# Patient Record
Sex: Female | Born: 1935 | Race: White | Hispanic: No | Smoking: Never smoker
Health system: Southern US, Community
[De-identification: ages and names within clinical notes are randomized; demographics above are authoritative.]

## PROBLEM LIST (undated history)

## (undated) DIAGNOSIS — Z9889 Other specified postprocedural states: Secondary | ICD-10-CM

## (undated) DIAGNOSIS — I10 Essential (primary) hypertension: Secondary | ICD-10-CM

## (undated) DIAGNOSIS — E559 Vitamin D deficiency, unspecified: Secondary | ICD-10-CM

## (undated) DIAGNOSIS — M1990 Unspecified osteoarthritis, unspecified site: Secondary | ICD-10-CM

---

## 2000-10-06 ENCOUNTER — Encounter: Admission: RE | Admit: 2000-10-06 | Discharge: 2000-10-06 | Payer: Self-pay

## 2019-02-07 ENCOUNTER — Other Ambulatory Visit: Payer: Self-pay

## 2019-02-07 NOTE — Patient Outreach (Signed)
[facility]) Care Management  02/07/2019  [name] 06-Feb-1935 [id number]  Medication Adherence call to Mrs. Amber Mckay HIPPA Compliant Voice message left with a call back number. [name] is showing past due under [place] [employer].   [name] CPhT Pharmacy Technician [facility] Care Management Direct Dial [phone]  Fax [phone] [name]

## 2020-08-12 ENCOUNTER — Other Ambulatory Visit: Payer: Self-pay | Admitting: Family Medicine

## 2020-08-12 ENCOUNTER — Other Ambulatory Visit (HOSPITAL_COMMUNITY): Payer: Self-pay | Admitting: Family Medicine

## 2020-08-12 DIAGNOSIS — R601 Generalized edema: Secondary | ICD-10-CM

## 2020-08-13 ENCOUNTER — Ambulatory Visit (HOSPITAL_COMMUNITY)
Admission: RE | Admit: 2020-08-13 | Discharge: 2020-08-13 | Disposition: A | Discharge status: Home / Self Care | Payer: Medicare PPO | Source: Ambulatory Visit | Attending: Family Medicine | Admitting: Family Medicine

## 2020-08-13 ENCOUNTER — Other Ambulatory Visit: Payer: Self-pay

## 2020-08-13 DIAGNOSIS — R601 Generalized edema: Secondary | ICD-10-CM | POA: Insufficient documentation

## 2020-09-30 DIAGNOSIS — I10 Essential (primary) hypertension: Secondary | ICD-10-CM | POA: Diagnosis not present

## 2020-09-30 DIAGNOSIS — D649 Anemia, unspecified: Secondary | ICD-10-CM | POA: Diagnosis not present

## 2020-09-30 DIAGNOSIS — D638 Anemia in other chronic diseases classified elsewhere: Secondary | ICD-10-CM | POA: Diagnosis not present

## 2020-09-30 DIAGNOSIS — Z79899 Other long term (current) drug therapy: Secondary | ICD-10-CM | POA: Diagnosis not present

## 2020-09-30 DIAGNOSIS — D509 Iron deficiency anemia, unspecified: Secondary | ICD-10-CM | POA: Diagnosis not present

## 2020-10-05 DIAGNOSIS — D509 Iron deficiency anemia, unspecified: Secondary | ICD-10-CM | POA: Diagnosis not present

## 2020-10-05 DIAGNOSIS — Z6823 Body mass index (BMI) 23.0-23.9, adult: Secondary | ICD-10-CM | POA: Diagnosis not present

## 2020-11-05 DIAGNOSIS — Z6823 Body mass index (BMI) 23.0-23.9, adult: Secondary | ICD-10-CM | POA: Diagnosis not present

## 2020-11-05 DIAGNOSIS — M810 Age-related osteoporosis without current pathological fracture: Secondary | ICD-10-CM | POA: Diagnosis not present

## 2020-11-05 DIAGNOSIS — M25562 Pain in left knee: Secondary | ICD-10-CM | POA: Diagnosis not present

## 2020-11-05 DIAGNOSIS — M150 Primary generalized (osteo)arthritis: Secondary | ICD-10-CM | POA: Diagnosis not present

## 2020-11-05 DIAGNOSIS — M7581 Other shoulder lesions, right shoulder: Secondary | ICD-10-CM | POA: Diagnosis not present

## 2020-11-05 DIAGNOSIS — M0609 Rheumatoid arthritis without rheumatoid factor, multiple sites: Secondary | ICD-10-CM | POA: Diagnosis not present

## 2020-11-05 DIAGNOSIS — E559 Vitamin D deficiency, unspecified: Secondary | ICD-10-CM | POA: Diagnosis not present

## 2020-12-23 DIAGNOSIS — I10 Essential (primary) hypertension: Secondary | ICD-10-CM | POA: Diagnosis not present

## 2020-12-23 DIAGNOSIS — Z13220 Encounter for screening for lipoid disorders: Secondary | ICD-10-CM | POA: Diagnosis not present

## 2020-12-23 DIAGNOSIS — Z79899 Other long term (current) drug therapy: Secondary | ICD-10-CM | POA: Diagnosis not present

## 2020-12-28 DIAGNOSIS — Z1389 Encounter for screening for other disorder: Secondary | ICD-10-CM | POA: Diagnosis not present

## 2020-12-28 DIAGNOSIS — Z79899 Other long term (current) drug therapy: Secondary | ICD-10-CM | POA: Diagnosis not present

## 2020-12-28 DIAGNOSIS — Z1331 Encounter for screening for depression: Secondary | ICD-10-CM | POA: Diagnosis not present

## 2020-12-28 DIAGNOSIS — R197 Diarrhea, unspecified: Secondary | ICD-10-CM | POA: Diagnosis not present

## 2020-12-28 DIAGNOSIS — J4540 Moderate persistent asthma, uncomplicated: Secondary | ICD-10-CM | POA: Diagnosis not present

## 2020-12-28 DIAGNOSIS — I10 Essential (primary) hypertension: Secondary | ICD-10-CM | POA: Diagnosis not present

## 2020-12-28 DIAGNOSIS — M0609 Rheumatoid arthritis without rheumatoid factor, multiple sites: Secondary | ICD-10-CM | POA: Diagnosis not present

## 2020-12-28 DIAGNOSIS — R42 Dizziness and giddiness: Secondary | ICD-10-CM | POA: Diagnosis not present

## 2021-01-29 DIAGNOSIS — M25511 Pain in right shoulder: Secondary | ICD-10-CM | POA: Diagnosis not present

## 2021-02-04 DIAGNOSIS — M0609 Rheumatoid arthritis without rheumatoid factor, multiple sites: Secondary | ICD-10-CM | POA: Diagnosis not present

## 2021-02-04 DIAGNOSIS — M25562 Pain in left knee: Secondary | ICD-10-CM | POA: Diagnosis not present

## 2021-02-04 DIAGNOSIS — M810 Age-related osteoporosis without current pathological fracture: Secondary | ICD-10-CM | POA: Diagnosis not present

## 2021-02-04 DIAGNOSIS — M150 Primary generalized (osteo)arthritis: Secondary | ICD-10-CM | POA: Diagnosis not present

## 2021-02-04 DIAGNOSIS — Z6824 Body mass index (BMI) 24.0-24.9, adult: Secondary | ICD-10-CM | POA: Diagnosis not present

## 2021-02-04 DIAGNOSIS — E559 Vitamin D deficiency, unspecified: Secondary | ICD-10-CM | POA: Diagnosis not present

## 2021-02-04 DIAGNOSIS — M7581 Other shoulder lesions, right shoulder: Secondary | ICD-10-CM | POA: Diagnosis not present

## 2021-03-08 DIAGNOSIS — Z23 Encounter for immunization: Secondary | ICD-10-CM | POA: Diagnosis not present

## 2021-03-25 DIAGNOSIS — I10 Essential (primary) hypertension: Secondary | ICD-10-CM | POA: Diagnosis not present

## 2021-03-25 DIAGNOSIS — M0609 Rheumatoid arthritis without rheumatoid factor, multiple sites: Secondary | ICD-10-CM | POA: Diagnosis not present

## 2021-03-27 DIAGNOSIS — R300 Dysuria: Secondary | ICD-10-CM | POA: Diagnosis not present

## 2021-03-27 DIAGNOSIS — Z6823 Body mass index (BMI) 23.0-23.9, adult: Secondary | ICD-10-CM | POA: Diagnosis not present

## 2021-06-18 DIAGNOSIS — Z79899 Other long term (current) drug therapy: Secondary | ICD-10-CM | POA: Diagnosis not present

## 2021-06-18 DIAGNOSIS — Z13220 Encounter for screening for lipoid disorders: Secondary | ICD-10-CM | POA: Diagnosis not present

## 2021-06-24 DIAGNOSIS — Z79899 Other long term (current) drug therapy: Secondary | ICD-10-CM | POA: Diagnosis not present

## 2021-06-24 DIAGNOSIS — M0609 Rheumatoid arthritis without rheumatoid factor, multiple sites: Secondary | ICD-10-CM | POA: Diagnosis not present

## 2021-06-24 DIAGNOSIS — Z6823 Body mass index (BMI) 23.0-23.9, adult: Secondary | ICD-10-CM | POA: Diagnosis not present

## 2021-06-24 DIAGNOSIS — I10 Essential (primary) hypertension: Secondary | ICD-10-CM | POA: Diagnosis not present

## 2021-06-24 DIAGNOSIS — R634 Abnormal weight loss: Secondary | ICD-10-CM | POA: Diagnosis not present

## 2021-06-24 DIAGNOSIS — J4540 Moderate persistent asthma, uncomplicated: Secondary | ICD-10-CM | POA: Diagnosis not present

## 2021-06-24 DIAGNOSIS — R42 Dizziness and giddiness: Secondary | ICD-10-CM | POA: Diagnosis not present

## 2021-06-24 DIAGNOSIS — R197 Diarrhea, unspecified: Secondary | ICD-10-CM | POA: Diagnosis not present

## 2021-07-16 IMAGING — US US EXTREM LOW VENOUS*R*
1 series · 13 of 24 positions shown · non-contrast
Comparison: None.

CLINICAL DATA: Right lower extremity edema for the past 2 weeks.
Evaluate for DVT.



[Series 1: us venous img lower uni right (dvt) · portal-venous · 13 of 36 slices shown]
[im 1/36]
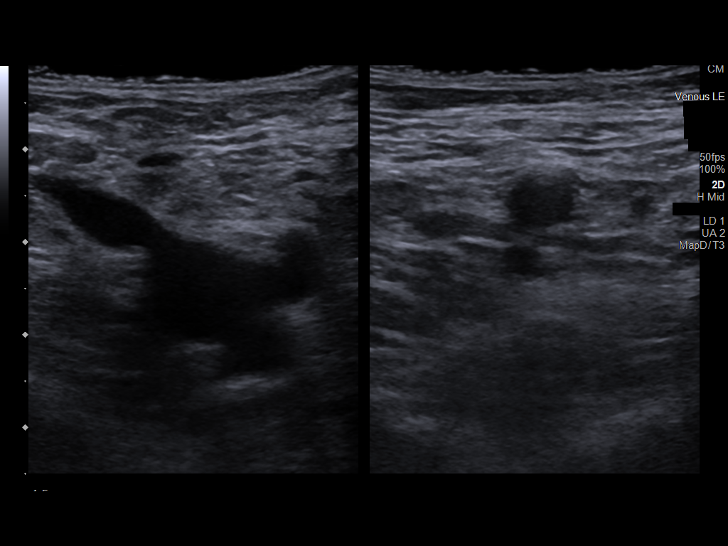
[im 4/36]
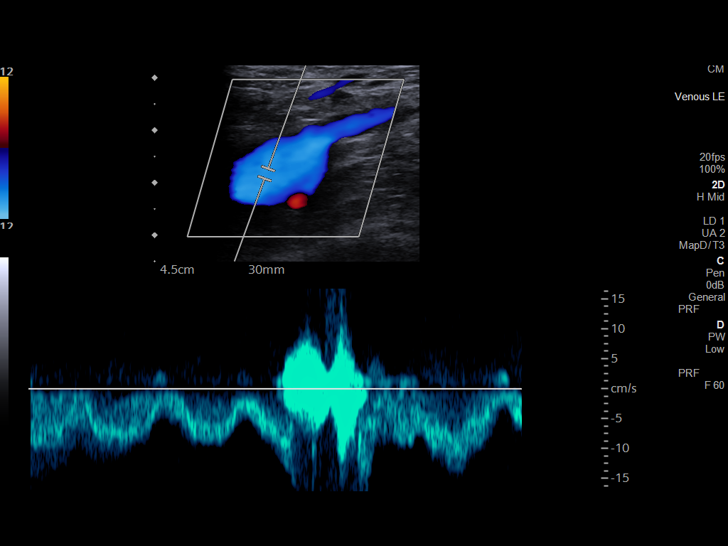
[im 7/36]
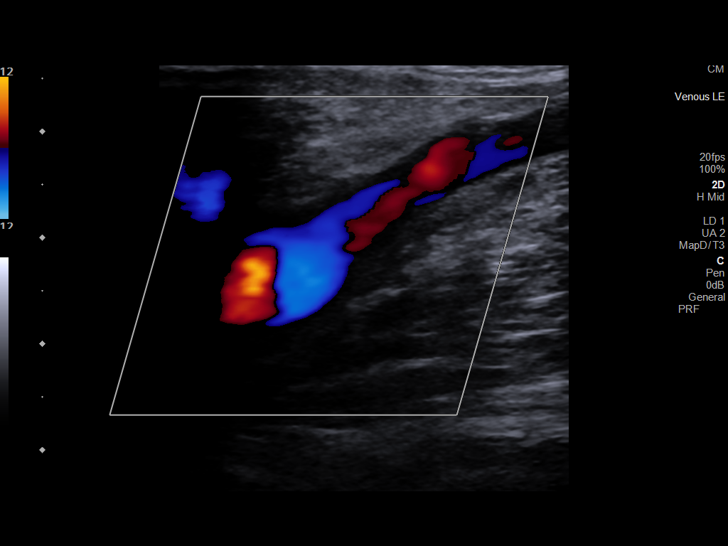
[im 10/36]
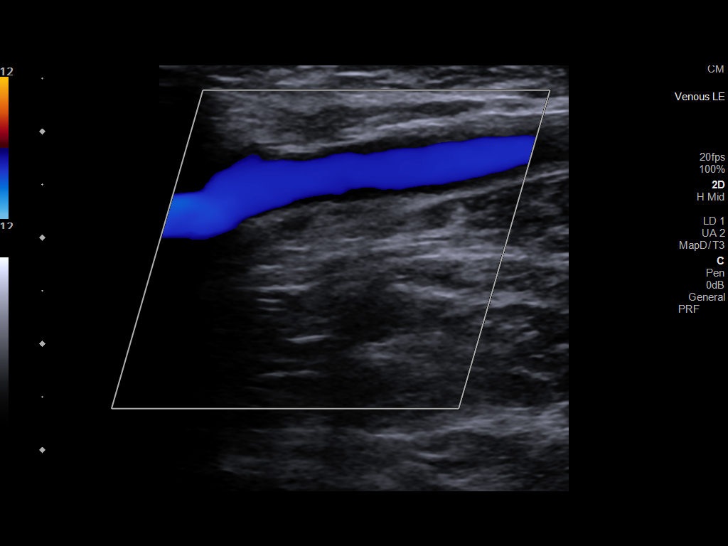
[im 13/36]
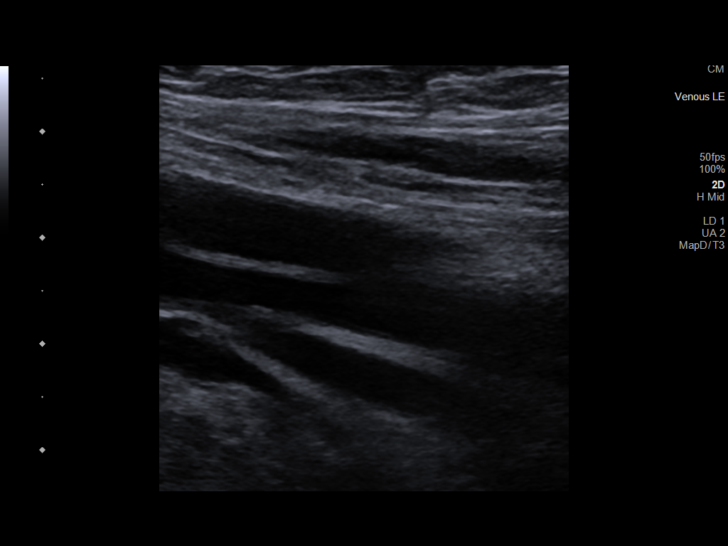
[im 16/36]
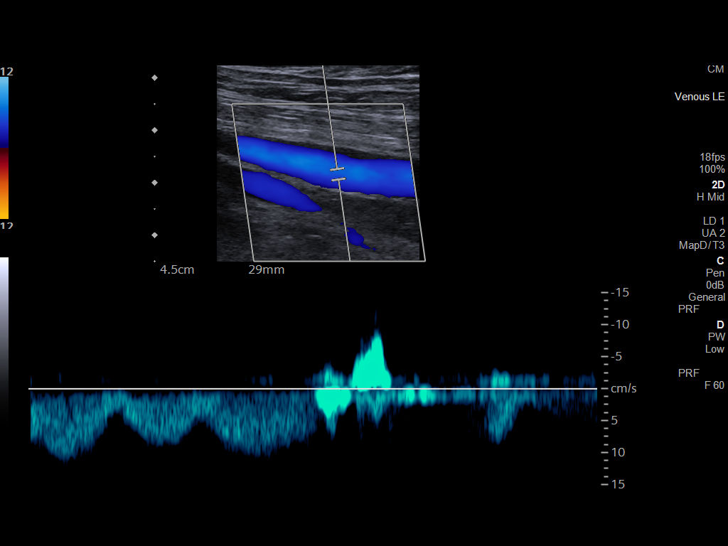
[im 19/36]
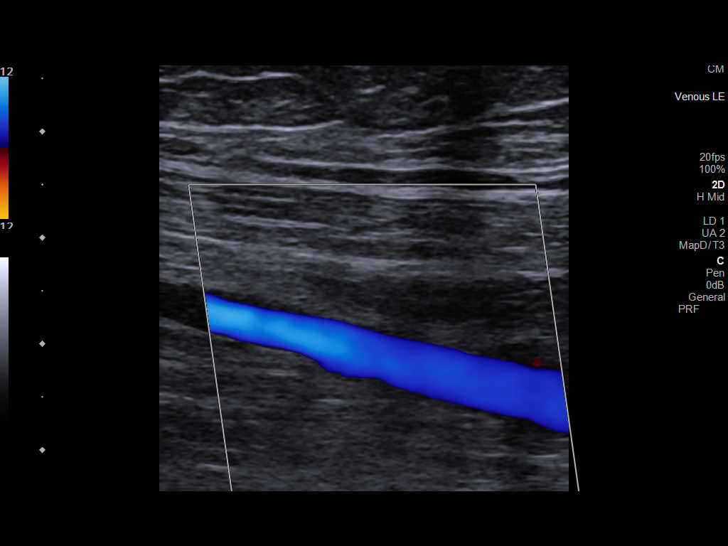
[im 20/36]
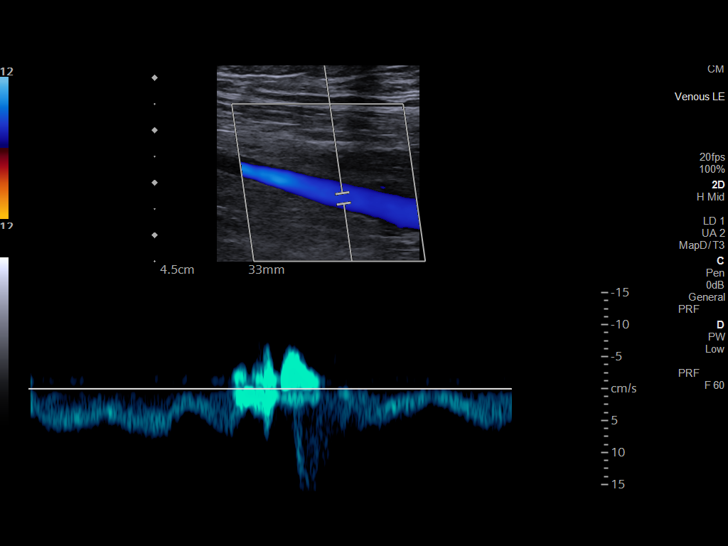
[im 23/36]
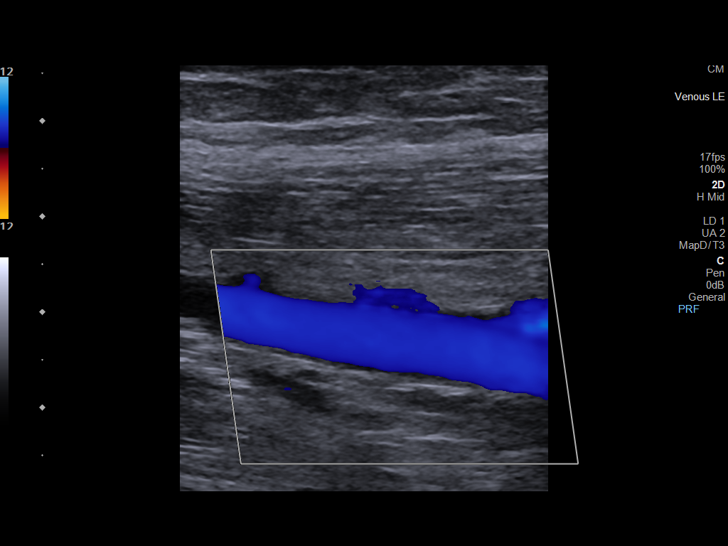
[im 26/36]
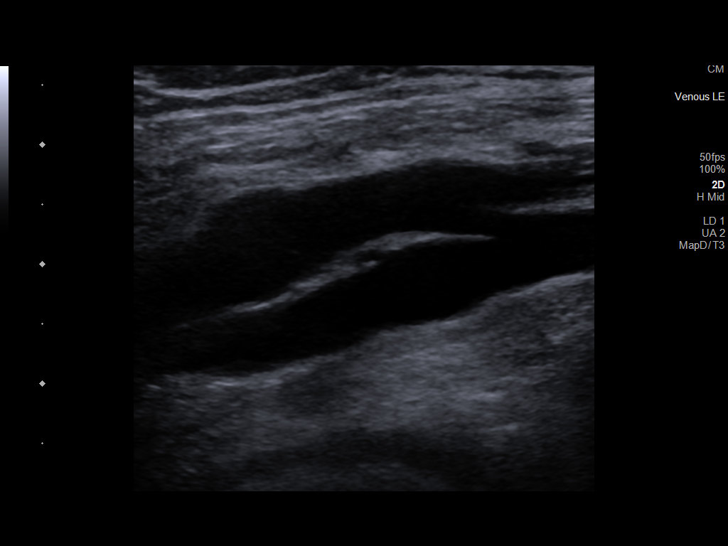
[im 29/36]
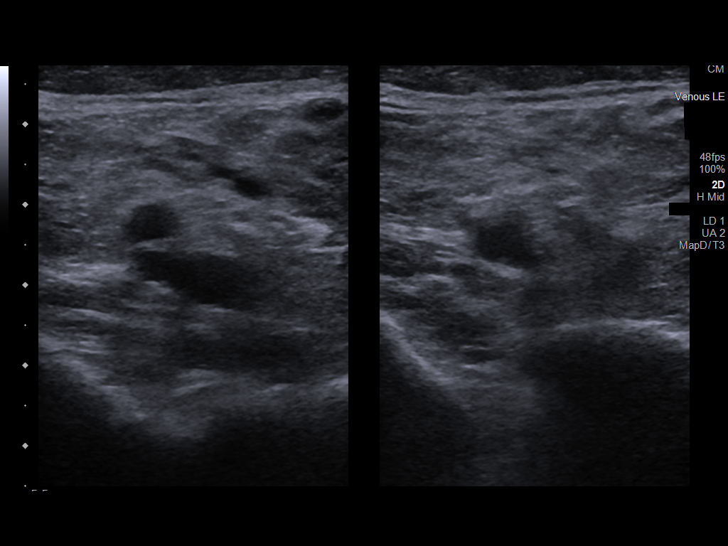
[im 32/36]
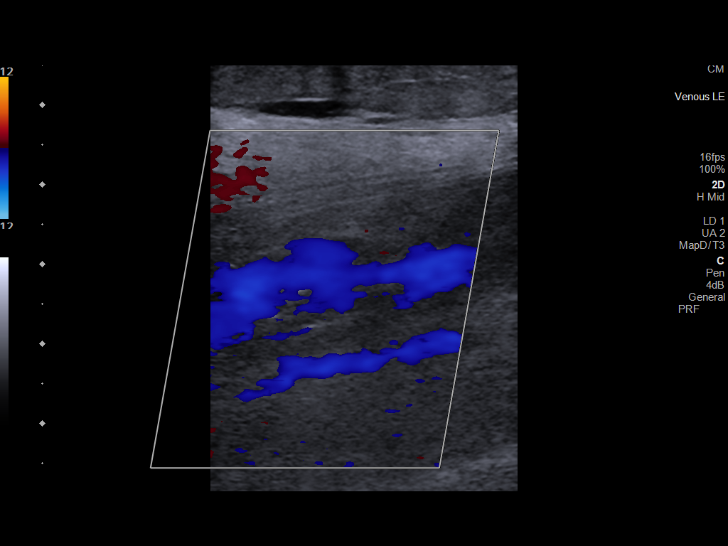
[im 36/36]
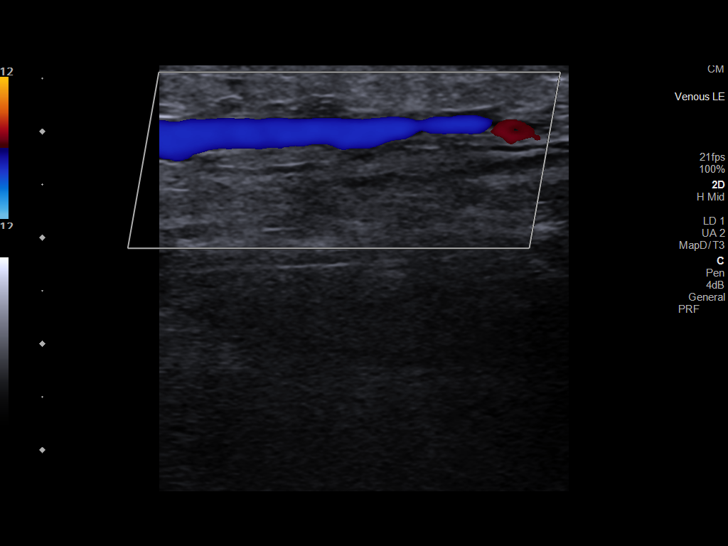

[13 of 24 positions shown; findings below may reference images not displayed]

FINDINGS: Contralateral Common Femoral Vein: Respiratory phasicity is normal
and symmetric with the symptomatic side. No evidence of thrombus.
Normal compressibility.

Common Femoral Vein: No evidence of thrombus. Normal
compressibility, respiratory phasicity and response to augmentation.

Saphenofemoral Junction: No evidence of thrombus. Normal
compressibility and flow on color Doppler imaging.

Profunda Femoral Vein: No evidence of thrombus. Normal
compressibility and flow on color Doppler imaging.

Femoral Vein: No evidence of thrombus. Normal compressibility,
respiratory phasicity and response to augmentation.

Popliteal Vein: No evidence of thrombus. Normal compressibility,
respiratory phasicity and response to augmentation.

Calf Veins: No evidence of thrombus. Normal compressibility and flow
on color Doppler imaging.

Superficial Great Saphenous Vein: No evidence of thrombus. Normal
compressibility.

Venous Reflux:  None.

Other Findings: There is a minimal amount of subcutaneous edema
involving the anterior aspect of the shin (image 36), without
definable/drainable fluid collection. Otherwise, there is no
sonographic correlate for patient's palpable area of concern.
Specifically, no regional superficial thrombophlebitis.
IMPRESSION: 1. No evidence of DVT within the right lower extremity.
2. Minimal amount of subcutaneous edema involving the anterior
aspect of the shin without definable/drainable fluid collection.

## 2021-08-09 DIAGNOSIS — M25562 Pain in left knee: Secondary | ICD-10-CM | POA: Diagnosis not present

## 2021-08-09 DIAGNOSIS — E559 Vitamin D deficiency, unspecified: Secondary | ICD-10-CM | POA: Diagnosis not present

## 2021-08-09 DIAGNOSIS — M150 Primary generalized (osteo)arthritis: Secondary | ICD-10-CM | POA: Diagnosis not present

## 2021-08-09 DIAGNOSIS — M0609 Rheumatoid arthritis without rheumatoid factor, multiple sites: Secondary | ICD-10-CM | POA: Diagnosis not present

## 2021-08-09 DIAGNOSIS — M810 Age-related osteoporosis without current pathological fracture: Secondary | ICD-10-CM | POA: Diagnosis not present

## 2021-08-09 DIAGNOSIS — M7581 Other shoulder lesions, right shoulder: Secondary | ICD-10-CM | POA: Diagnosis not present

## 2021-08-09 DIAGNOSIS — Z6823 Body mass index (BMI) 23.0-23.9, adult: Secondary | ICD-10-CM | POA: Diagnosis not present

## 2021-08-18 DIAGNOSIS — Z23 Encounter for immunization: Secondary | ICD-10-CM | POA: Diagnosis not present

## 2021-09-21 DIAGNOSIS — I10 Essential (primary) hypertension: Secondary | ICD-10-CM | POA: Diagnosis not present

## 2021-09-21 DIAGNOSIS — D509 Iron deficiency anemia, unspecified: Secondary | ICD-10-CM | POA: Diagnosis not present

## 2021-09-21 DIAGNOSIS — Z79899 Other long term (current) drug therapy: Secondary | ICD-10-CM | POA: Diagnosis not present

## 2021-10-12 DIAGNOSIS — Z8679 Personal history of other diseases of the circulatory system: Secondary | ICD-10-CM | POA: Diagnosis not present

## 2021-10-12 DIAGNOSIS — I517 Cardiomegaly: Secondary | ICD-10-CM | POA: Diagnosis not present

## 2021-10-12 DIAGNOSIS — I10 Essential (primary) hypertension: Secondary | ICD-10-CM | POA: Diagnosis not present

## 2021-10-12 DIAGNOSIS — R202 Paresthesia of skin: Secondary | ICD-10-CM | POA: Diagnosis not present

## 2021-10-12 DIAGNOSIS — R200 Anesthesia of skin: Secondary | ICD-10-CM | POA: Diagnosis not present

## 2021-10-12 DIAGNOSIS — I493 Ventricular premature depolarization: Secondary | ICD-10-CM | POA: Diagnosis not present

## 2021-10-12 DIAGNOSIS — R4182 Altered mental status, unspecified: Secondary | ICD-10-CM | POA: Diagnosis not present

## 2021-10-12 DIAGNOSIS — I4891 Unspecified atrial fibrillation: Secondary | ICD-10-CM | POA: Diagnosis not present

## 2021-10-12 DIAGNOSIS — Z20822 Contact with and (suspected) exposure to covid-19: Secondary | ICD-10-CM | POA: Diagnosis not present

## 2021-10-12 DIAGNOSIS — I672 Cerebral atherosclerosis: Secondary | ICD-10-CM | POA: Diagnosis not present

## 2021-10-12 DIAGNOSIS — E876 Hypokalemia: Secondary | ICD-10-CM | POA: Diagnosis not present

## 2021-10-12 DIAGNOSIS — M069 Rheumatoid arthritis, unspecified: Secondary | ICD-10-CM | POA: Diagnosis not present

## 2021-10-12 DIAGNOSIS — R29700 NIHSS score 0: Secondary | ICD-10-CM | POA: Diagnosis not present

## 2021-10-12 DIAGNOSIS — K449 Diaphragmatic hernia without obstruction or gangrene: Secondary | ICD-10-CM | POA: Diagnosis not present

## 2021-10-12 DIAGNOSIS — I639 Cerebral infarction, unspecified: Secondary | ICD-10-CM | POA: Diagnosis not present

## 2021-10-12 DIAGNOSIS — R4189 Other symptoms and signs involving cognitive functions and awareness: Secondary | ICD-10-CM | POA: Diagnosis not present

## 2021-10-12 DIAGNOSIS — I6523 Occlusion and stenosis of bilateral carotid arteries: Secondary | ICD-10-CM | POA: Diagnosis not present

## 2021-10-12 DIAGNOSIS — R29818 Other symptoms and signs involving the nervous system: Secondary | ICD-10-CM | POA: Diagnosis not present

## 2021-10-13 DIAGNOSIS — I639 Cerebral infarction, unspecified: Secondary | ICD-10-CM | POA: Diagnosis not present

## 2021-10-13 DIAGNOSIS — I340 Nonrheumatic mitral (valve) insufficiency: Secondary | ICD-10-CM | POA: Diagnosis not present

## 2021-10-13 DIAGNOSIS — I5189 Other ill-defined heart diseases: Secondary | ICD-10-CM | POA: Diagnosis not present

## 2021-10-13 DIAGNOSIS — I517 Cardiomegaly: Secondary | ICD-10-CM | POA: Diagnosis not present

## 2021-10-13 DIAGNOSIS — I4891 Unspecified atrial fibrillation: Secondary | ICD-10-CM | POA: Diagnosis not present

## 2021-10-28 DIAGNOSIS — J4541 Moderate persistent asthma with (acute) exacerbation: Secondary | ICD-10-CM | POA: Diagnosis not present

## 2021-10-28 DIAGNOSIS — J45901 Unspecified asthma with (acute) exacerbation: Secondary | ICD-10-CM | POA: Diagnosis not present

## 2021-10-28 DIAGNOSIS — I6523 Occlusion and stenosis of bilateral carotid arteries: Secondary | ICD-10-CM | POA: Diagnosis not present

## 2021-10-28 DIAGNOSIS — I679 Cerebrovascular disease, unspecified: Secondary | ICD-10-CM | POA: Diagnosis not present

## 2021-10-28 DIAGNOSIS — M0609 Rheumatoid arthritis without rheumatoid factor, multiple sites: Secondary | ICD-10-CM | POA: Diagnosis not present

## 2021-11-12 DIAGNOSIS — I679 Cerebrovascular disease, unspecified: Secondary | ICD-10-CM | POA: Diagnosis not present

## 2021-11-12 DIAGNOSIS — I10 Essential (primary) hypertension: Secondary | ICD-10-CM | POA: Diagnosis not present

## 2021-11-12 DIAGNOSIS — M0609 Rheumatoid arthritis without rheumatoid factor, multiple sites: Secondary | ICD-10-CM | POA: Diagnosis not present

## 2021-11-12 DIAGNOSIS — J4541 Moderate persistent asthma with (acute) exacerbation: Secondary | ICD-10-CM | POA: Diagnosis not present

## 2021-11-26 DIAGNOSIS — R300 Dysuria: Secondary | ICD-10-CM | POA: Diagnosis not present

## 2021-11-26 DIAGNOSIS — R5383 Other fatigue: Secondary | ICD-10-CM | POA: Diagnosis not present

## 2021-11-26 DIAGNOSIS — Z6824 Body mass index (BMI) 24.0-24.9, adult: Secondary | ICD-10-CM | POA: Diagnosis not present

## 2021-12-20 DIAGNOSIS — I10 Essential (primary) hypertension: Secondary | ICD-10-CM | POA: Diagnosis not present

## 2021-12-20 DIAGNOSIS — R300 Dysuria: Secondary | ICD-10-CM | POA: Diagnosis not present

## 2021-12-23 DIAGNOSIS — J4540 Moderate persistent asthma, uncomplicated: Secondary | ICD-10-CM | POA: Diagnosis not present

## 2021-12-23 DIAGNOSIS — Z0001 Encounter for general adult medical examination with abnormal findings: Secondary | ICD-10-CM | POA: Diagnosis not present

## 2021-12-23 DIAGNOSIS — M0609 Rheumatoid arthritis without rheumatoid factor, multiple sites: Secondary | ICD-10-CM | POA: Diagnosis not present

## 2021-12-23 DIAGNOSIS — R197 Diarrhea, unspecified: Secondary | ICD-10-CM | POA: Diagnosis not present

## 2021-12-23 DIAGNOSIS — I10 Essential (primary) hypertension: Secondary | ICD-10-CM | POA: Diagnosis not present

## 2021-12-23 DIAGNOSIS — Z79899 Other long term (current) drug therapy: Secondary | ICD-10-CM | POA: Diagnosis not present

## 2021-12-23 DIAGNOSIS — R42 Dizziness and giddiness: Secondary | ICD-10-CM | POA: Diagnosis not present

## 2021-12-23 DIAGNOSIS — R634 Abnormal weight loss: Secondary | ICD-10-CM | POA: Diagnosis not present

## 2022-01-18 DIAGNOSIS — M1991 Primary osteoarthritis, unspecified site: Secondary | ICD-10-CM | POA: Diagnosis not present

## 2022-01-18 DIAGNOSIS — M25562 Pain in left knee: Secondary | ICD-10-CM | POA: Diagnosis not present

## 2022-01-18 DIAGNOSIS — M810 Age-related osteoporosis without current pathological fracture: Secondary | ICD-10-CM | POA: Diagnosis not present

## 2022-01-18 DIAGNOSIS — E559 Vitamin D deficiency, unspecified: Secondary | ICD-10-CM | POA: Diagnosis not present

## 2022-01-18 DIAGNOSIS — Z6822 Body mass index (BMI) 22.0-22.9, adult: Secondary | ICD-10-CM | POA: Diagnosis not present

## 2022-01-18 DIAGNOSIS — M7581 Other shoulder lesions, right shoulder: Secondary | ICD-10-CM | POA: Diagnosis not present

## 2022-01-18 DIAGNOSIS — M0609 Rheumatoid arthritis without rheumatoid factor, multiple sites: Secondary | ICD-10-CM | POA: Diagnosis not present

## 2022-02-14 DIAGNOSIS — Z6822 Body mass index (BMI) 22.0-22.9, adult: Secondary | ICD-10-CM | POA: Diagnosis not present

## 2022-02-14 DIAGNOSIS — J45901 Unspecified asthma with (acute) exacerbation: Secondary | ICD-10-CM | POA: Diagnosis not present

## 2022-02-14 DIAGNOSIS — J309 Allergic rhinitis, unspecified: Secondary | ICD-10-CM | POA: Diagnosis not present

## 2022-02-14 DIAGNOSIS — J4541 Moderate persistent asthma with (acute) exacerbation: Secondary | ICD-10-CM | POA: Diagnosis not present

## 2022-03-23 DIAGNOSIS — I10 Essential (primary) hypertension: Secondary | ICD-10-CM | POA: Diagnosis not present

## 2022-03-23 DIAGNOSIS — Z79899 Other long term (current) drug therapy: Secondary | ICD-10-CM | POA: Diagnosis not present

## 2022-04-18 DIAGNOSIS — C44329 Squamous cell carcinoma of skin of other parts of face: Secondary | ICD-10-CM | POA: Diagnosis not present

## 2022-05-13 DIAGNOSIS — R030 Elevated blood-pressure reading, without diagnosis of hypertension: Secondary | ICD-10-CM | POA: Diagnosis not present

## 2022-05-13 DIAGNOSIS — Z6822 Body mass index (BMI) 22.0-22.9, adult: Secondary | ICD-10-CM | POA: Diagnosis not present

## 2022-05-13 DIAGNOSIS — N3001 Acute cystitis with hematuria: Secondary | ICD-10-CM | POA: Diagnosis not present

## 2022-06-01 DIAGNOSIS — Z08 Encounter for follow-up examination after completed treatment for malignant neoplasm: Secondary | ICD-10-CM | POA: Diagnosis not present

## 2022-06-01 DIAGNOSIS — Z85828 Personal history of other malignant neoplasm of skin: Secondary | ICD-10-CM | POA: Diagnosis not present

## 2022-06-17 DIAGNOSIS — I10 Essential (primary) hypertension: Secondary | ICD-10-CM | POA: Diagnosis not present

## 2022-06-17 DIAGNOSIS — Z79899 Other long term (current) drug therapy: Secondary | ICD-10-CM | POA: Diagnosis not present

## 2022-06-17 DIAGNOSIS — I679 Cerebrovascular disease, unspecified: Secondary | ICD-10-CM | POA: Diagnosis not present

## 2022-06-22 DIAGNOSIS — R634 Abnormal weight loss: Secondary | ICD-10-CM | POA: Diagnosis not present

## 2022-06-22 DIAGNOSIS — M0609 Rheumatoid arthritis without rheumatoid factor, multiple sites: Secondary | ICD-10-CM | POA: Diagnosis not present

## 2022-06-22 DIAGNOSIS — Z6823 Body mass index (BMI) 23.0-23.9, adult: Secondary | ICD-10-CM | POA: Diagnosis not present

## 2022-06-22 DIAGNOSIS — R197 Diarrhea, unspecified: Secondary | ICD-10-CM | POA: Diagnosis not present

## 2022-06-22 DIAGNOSIS — Z79899 Other long term (current) drug therapy: Secondary | ICD-10-CM | POA: Diagnosis not present

## 2022-06-22 DIAGNOSIS — R42 Dizziness and giddiness: Secondary | ICD-10-CM | POA: Diagnosis not present

## 2022-06-22 DIAGNOSIS — J4540 Moderate persistent asthma, uncomplicated: Secondary | ICD-10-CM | POA: Diagnosis not present

## 2022-06-22 DIAGNOSIS — I10 Essential (primary) hypertension: Secondary | ICD-10-CM | POA: Diagnosis not present

## 2022-06-22 DIAGNOSIS — G4700 Insomnia, unspecified: Secondary | ICD-10-CM | POA: Diagnosis not present

## 2022-07-05 DIAGNOSIS — N3090 Cystitis, unspecified without hematuria: Secondary | ICD-10-CM | POA: Diagnosis not present

## 2022-07-05 DIAGNOSIS — Z6823 Body mass index (BMI) 23.0-23.9, adult: Secondary | ICD-10-CM | POA: Diagnosis not present

## 2022-07-12 DIAGNOSIS — D0439 Carcinoma in situ of skin of other parts of face: Secondary | ICD-10-CM | POA: Diagnosis not present

## 2022-07-12 DIAGNOSIS — C44329 Squamous cell carcinoma of skin of other parts of face: Secondary | ICD-10-CM | POA: Diagnosis not present

## 2022-07-21 DIAGNOSIS — M810 Age-related osteoporosis without current pathological fracture: Secondary | ICD-10-CM | POA: Diagnosis not present

## 2022-07-21 DIAGNOSIS — M1991 Primary osteoarthritis, unspecified site: Secondary | ICD-10-CM | POA: Diagnosis not present

## 2022-07-21 DIAGNOSIS — M25562 Pain in left knee: Secondary | ICD-10-CM | POA: Diagnosis not present

## 2022-07-21 DIAGNOSIS — Z6823 Body mass index (BMI) 23.0-23.9, adult: Secondary | ICD-10-CM | POA: Diagnosis not present

## 2022-07-21 DIAGNOSIS — E559 Vitamin D deficiency, unspecified: Secondary | ICD-10-CM | POA: Diagnosis not present

## 2022-07-21 DIAGNOSIS — M0609 Rheumatoid arthritis without rheumatoid factor, multiple sites: Secondary | ICD-10-CM | POA: Diagnosis not present

## 2022-07-21 DIAGNOSIS — M7581 Other shoulder lesions, right shoulder: Secondary | ICD-10-CM | POA: Diagnosis not present

## 2022-07-28 DIAGNOSIS — Z23 Encounter for immunization: Secondary | ICD-10-CM | POA: Diagnosis not present

## 2022-08-08 DIAGNOSIS — H25813 Combined forms of age-related cataract, bilateral: Secondary | ICD-10-CM | POA: Diagnosis not present

## 2022-08-18 DIAGNOSIS — C44329 Squamous cell carcinoma of skin of other parts of face: Secondary | ICD-10-CM | POA: Diagnosis not present

## 2022-08-18 DIAGNOSIS — D485 Neoplasm of uncertain behavior of skin: Secondary | ICD-10-CM | POA: Diagnosis not present

## 2022-08-24 DIAGNOSIS — Z23 Encounter for immunization: Secondary | ICD-10-CM | POA: Diagnosis not present

## 2022-09-05 DIAGNOSIS — C44329 Squamous cell carcinoma of skin of other parts of face: Secondary | ICD-10-CM | POA: Diagnosis not present

## 2022-09-05 DIAGNOSIS — X32XXXD Exposure to sunlight, subsequent encounter: Secondary | ICD-10-CM | POA: Diagnosis not present

## 2022-09-05 DIAGNOSIS — L570 Actinic keratosis: Secondary | ICD-10-CM | POA: Diagnosis not present

## 2022-09-12 DIAGNOSIS — Z6822 Body mass index (BMI) 22.0-22.9, adult: Secondary | ICD-10-CM | POA: Diagnosis not present

## 2022-09-12 DIAGNOSIS — J45909 Unspecified asthma, uncomplicated: Secondary | ICD-10-CM | POA: Diagnosis not present

## 2022-09-12 DIAGNOSIS — R030 Elevated blood-pressure reading, without diagnosis of hypertension: Secondary | ICD-10-CM | POA: Diagnosis not present

## 2022-09-12 DIAGNOSIS — L259 Unspecified contact dermatitis, unspecified cause: Secondary | ICD-10-CM | POA: Diagnosis not present

## 2022-09-20 DIAGNOSIS — R5383 Other fatigue: Secondary | ICD-10-CM | POA: Diagnosis not present

## 2022-09-20 DIAGNOSIS — R609 Edema, unspecified: Secondary | ICD-10-CM | POA: Diagnosis not present

## 2022-09-20 DIAGNOSIS — D509 Iron deficiency anemia, unspecified: Secondary | ICD-10-CM | POA: Diagnosis not present

## 2022-10-17 DIAGNOSIS — L570 Actinic keratosis: Secondary | ICD-10-CM | POA: Diagnosis not present

## 2022-10-17 DIAGNOSIS — L258 Unspecified contact dermatitis due to other agents: Secondary | ICD-10-CM | POA: Diagnosis not present

## 2022-10-17 DIAGNOSIS — Z08 Encounter for follow-up examination after completed treatment for malignant neoplasm: Secondary | ICD-10-CM | POA: Diagnosis not present

## 2022-10-17 DIAGNOSIS — X32XXXD Exposure to sunlight, subsequent encounter: Secondary | ICD-10-CM | POA: Diagnosis not present

## 2022-10-17 DIAGNOSIS — Z85828 Personal history of other malignant neoplasm of skin: Secondary | ICD-10-CM | POA: Diagnosis not present

## 2022-12-19 DIAGNOSIS — I10 Essential (primary) hypertension: Secondary | ICD-10-CM | POA: Diagnosis not present

## 2022-12-19 DIAGNOSIS — Z13220 Encounter for screening for lipoid disorders: Secondary | ICD-10-CM | POA: Diagnosis not present

## 2022-12-19 DIAGNOSIS — Z79899 Other long term (current) drug therapy: Secondary | ICD-10-CM | POA: Diagnosis not present

## 2022-12-23 DIAGNOSIS — D509 Iron deficiency anemia, unspecified: Secondary | ICD-10-CM | POA: Diagnosis not present

## 2022-12-23 DIAGNOSIS — Z0001 Encounter for general adult medical examination with abnormal findings: Secondary | ICD-10-CM | POA: Diagnosis not present

## 2022-12-23 DIAGNOSIS — I10 Essential (primary) hypertension: Secondary | ICD-10-CM | POA: Diagnosis not present

## 2022-12-26 DIAGNOSIS — R634 Abnormal weight loss: Secondary | ICD-10-CM | POA: Diagnosis not present

## 2022-12-26 DIAGNOSIS — Z79899 Other long term (current) drug therapy: Secondary | ICD-10-CM | POA: Diagnosis not present

## 2022-12-26 DIAGNOSIS — G4700 Insomnia, unspecified: Secondary | ICD-10-CM | POA: Diagnosis not present

## 2022-12-26 DIAGNOSIS — R42 Dizziness and giddiness: Secondary | ICD-10-CM | POA: Diagnosis not present

## 2022-12-26 DIAGNOSIS — M0609 Rheumatoid arthritis without rheumatoid factor, multiple sites: Secondary | ICD-10-CM | POA: Diagnosis not present

## 2022-12-26 DIAGNOSIS — J4540 Moderate persistent asthma, uncomplicated: Secondary | ICD-10-CM | POA: Diagnosis not present

## 2022-12-26 DIAGNOSIS — R197 Diarrhea, unspecified: Secondary | ICD-10-CM | POA: Diagnosis not present

## 2022-12-26 DIAGNOSIS — I10 Essential (primary) hypertension: Secondary | ICD-10-CM | POA: Diagnosis not present

## 2022-12-26 DIAGNOSIS — E7849 Other hyperlipidemia: Secondary | ICD-10-CM | POA: Diagnosis not present

## 2023-01-19 DIAGNOSIS — M7581 Other shoulder lesions, right shoulder: Secondary | ICD-10-CM | POA: Diagnosis not present

## 2023-01-19 DIAGNOSIS — M0609 Rheumatoid arthritis without rheumatoid factor, multiple sites: Secondary | ICD-10-CM | POA: Diagnosis not present

## 2023-01-19 DIAGNOSIS — E559 Vitamin D deficiency, unspecified: Secondary | ICD-10-CM | POA: Diagnosis not present

## 2023-01-19 DIAGNOSIS — M25562 Pain in left knee: Secondary | ICD-10-CM | POA: Diagnosis not present

## 2023-01-19 DIAGNOSIS — Z6822 Body mass index (BMI) 22.0-22.9, adult: Secondary | ICD-10-CM | POA: Diagnosis not present

## 2023-01-19 DIAGNOSIS — M810 Age-related osteoporosis without current pathological fracture: Secondary | ICD-10-CM | POA: Diagnosis not present

## 2023-01-19 DIAGNOSIS — M1991 Primary osteoarthritis, unspecified site: Secondary | ICD-10-CM | POA: Diagnosis not present

## 2023-03-28 DIAGNOSIS — D509 Iron deficiency anemia, unspecified: Secondary | ICD-10-CM | POA: Diagnosis not present

## 2023-03-28 DIAGNOSIS — R5383 Other fatigue: Secondary | ICD-10-CM | POA: Diagnosis not present

## 2023-03-28 DIAGNOSIS — E7849 Other hyperlipidemia: Secondary | ICD-10-CM | POA: Diagnosis not present

## 2023-03-28 DIAGNOSIS — I10 Essential (primary) hypertension: Secondary | ICD-10-CM | POA: Diagnosis not present

## 2023-06-20 DIAGNOSIS — E7849 Other hyperlipidemia: Secondary | ICD-10-CM | POA: Diagnosis not present

## 2023-06-20 DIAGNOSIS — I10 Essential (primary) hypertension: Secondary | ICD-10-CM | POA: Diagnosis not present

## 2023-06-22 DIAGNOSIS — H01002 Unspecified blepharitis right lower eyelid: Secondary | ICD-10-CM | POA: Diagnosis not present

## 2023-06-22 DIAGNOSIS — H01004 Unspecified blepharitis left upper eyelid: Secondary | ICD-10-CM | POA: Diagnosis not present

## 2023-06-22 DIAGNOSIS — H01001 Unspecified blepharitis right upper eyelid: Secondary | ICD-10-CM | POA: Diagnosis not present

## 2023-06-22 DIAGNOSIS — H25813 Combined forms of age-related cataract, bilateral: Secondary | ICD-10-CM | POA: Diagnosis not present

## 2023-06-27 DIAGNOSIS — E7849 Other hyperlipidemia: Secondary | ICD-10-CM | POA: Diagnosis not present

## 2023-06-27 DIAGNOSIS — R42 Dizziness and giddiness: Secondary | ICD-10-CM | POA: Diagnosis not present

## 2023-06-27 DIAGNOSIS — I10 Essential (primary) hypertension: Secondary | ICD-10-CM | POA: Diagnosis not present

## 2023-06-27 DIAGNOSIS — M0609 Rheumatoid arthritis without rheumatoid factor, multiple sites: Secondary | ICD-10-CM | POA: Diagnosis not present

## 2023-06-27 DIAGNOSIS — G4700 Insomnia, unspecified: Secondary | ICD-10-CM | POA: Diagnosis not present

## 2023-06-27 DIAGNOSIS — J4540 Moderate persistent asthma, uncomplicated: Secondary | ICD-10-CM | POA: Diagnosis not present

## 2023-06-27 DIAGNOSIS — Z79899 Other long term (current) drug therapy: Secondary | ICD-10-CM | POA: Diagnosis not present

## 2023-06-27 DIAGNOSIS — R634 Abnormal weight loss: Secondary | ICD-10-CM | POA: Diagnosis not present

## 2023-06-27 DIAGNOSIS — R197 Diarrhea, unspecified: Secondary | ICD-10-CM | POA: Diagnosis not present

## 2023-07-04 DIAGNOSIS — L308 Other specified dermatitis: Secondary | ICD-10-CM | POA: Diagnosis not present

## 2023-07-04 DIAGNOSIS — L309 Dermatitis, unspecified: Secondary | ICD-10-CM | POA: Diagnosis not present

## 2023-07-13 DIAGNOSIS — M25562 Pain in left knee: Secondary | ICD-10-CM | POA: Diagnosis not present

## 2023-07-13 DIAGNOSIS — R5383 Other fatigue: Secondary | ICD-10-CM | POA: Diagnosis not present

## 2023-07-13 DIAGNOSIS — M1991 Primary osteoarthritis, unspecified site: Secondary | ICD-10-CM | POA: Diagnosis not present

## 2023-07-13 DIAGNOSIS — Z6821 Body mass index (BMI) 21.0-21.9, adult: Secondary | ICD-10-CM | POA: Diagnosis not present

## 2023-07-13 DIAGNOSIS — M0609 Rheumatoid arthritis without rheumatoid factor, multiple sites: Secondary | ICD-10-CM | POA: Diagnosis not present

## 2023-07-13 DIAGNOSIS — M7581 Other shoulder lesions, right shoulder: Secondary | ICD-10-CM | POA: Diagnosis not present

## 2023-07-13 DIAGNOSIS — M810 Age-related osteoporosis without current pathological fracture: Secondary | ICD-10-CM | POA: Diagnosis not present

## 2023-07-13 DIAGNOSIS — Z111 Encounter for screening for respiratory tuberculosis: Secondary | ICD-10-CM | POA: Diagnosis not present

## 2023-07-13 DIAGNOSIS — E559 Vitamin D deficiency, unspecified: Secondary | ICD-10-CM | POA: Diagnosis not present

## 2023-07-31 DIAGNOSIS — Z23 Encounter for immunization: Secondary | ICD-10-CM | POA: Diagnosis not present

## 2023-09-18 DIAGNOSIS — H25812 Combined forms of age-related cataract, left eye: Secondary | ICD-10-CM | POA: Diagnosis not present

## 2023-09-20 ENCOUNTER — Encounter (HOSPITAL_COMMUNITY): Payer: Self-pay

## 2023-09-20 NOTE — H&P (Signed)
Surgical History & Physical  Patient Name: Amber Mckay  DOB: Jan 30, 1935  Surgery: Cataract extraction with intraocular lens implant phacoemulsification; Left Eye Surgeon: Fabio Pierce MD Surgery Date: 09/25/2023 Pre-Op Date: 09/14/2023  HPI: A 68 Yr. old female patient 1. The patient is returning for follow up visit for blurry vision and cataracts. The patient's vision is blurry. The condition's severity is constant. The complaint is associated with difficulty with glare on bright sunny days, difficulty driving at night due to halos/glare, and difficulty recognizing people at a distance. This is constant. This is negatively affecting the patient's quality of life and the patient is unable to function adequately in life with the current level of vision. HPI Completed by Dr. Fabio Pierce  Medical History: Cataracts  Arthritis LDL Lung Problems  Review of Systems Cardiovascular High Blood Pressure Musculoskeletal Joint Ache, Pain Respiratory Asthma All recorded systems are negative except as noted above.  Social Never smoked   Medication Lisinopril, Albuterol, Folic acid, Methotrexate,  triamcinolone acetonide ,  folic acid ,  hydroxychloroquine ,  lisinopril ,  albuterol sulfate ,  methotrexate sodium ,  Arnuity Ellipta ,  pantoprazole   Sx/Procedures Hysterectomy  Drug Allergies  Sulfa (Sulfonamide Antibiotics)   History & Physical: Heent: cataracts NECK: supple without bruits LUNGS: lungs clear to auscultation CV: regular rate and rhythm Abdomen: soft and non-tender  Impression & Plan: Assessment: 1.  COMBINED FORMS AGE RELATED CATARACT; Both Eyes (H25.813) 2.  BLEPHARITIS; Right Upper Lid, Right Lower Lid, Left Upper Lid, Left Lower Lid (H01.001, H01.002,H01.004,H01.005) 3.  DERMATOCHALASIS, no surgery; Right Upper Lid, Left Upper Lid (H02.831, H02.834) 4.  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  Dry Eye Syndrome; Both Eyes (H04.123)  Plan: 1.  Cataract accounts  for the patient's decreased vision. This visual impairment is not correctable with a tolerable change in glasses or contact lenses. Cataract surgery with an implantation of a new lens should significantly improve the visual and functional status of the patient. Discussed all risks, benefits, alternatives, and potential complications. Discussed the procedures and recovery. Patient desires to have surgery. A-scan ordered and performed today for intra-ocular lens calculations. The surgery will be performed in order to improve vision for driving, reading, and for eye examinations. Recommend phacoemulsification with intra-ocular lens. Recommend Dextenza for post-operative pain and inflammation. Left Eye worse - first. Dilates well - shugarcaine by protocol. Vision Blue in room. Use topography ks for iol calcs.  2.  Blepharitis is present - recommend regular lid cleaning.  3.  Asymptomatic, recommend observation for now. Findings, prognosis and treatment options reviewed.  4.  Observe; Artificial tears as needed for irritation.  5.  Preservative Free Artificial tears 1 drop 2-3x/day.

## 2023-09-21 ENCOUNTER — Encounter (HOSPITAL_COMMUNITY): Payer: Self-pay

## 2023-09-21 ENCOUNTER — Encounter (HOSPITAL_COMMUNITY)
Admission: RE | Admit: 2023-09-21 | Discharge: 2023-09-21 | Disposition: A | Discharge status: Home / Self Care | Payer: Medicare PPO | Source: Ambulatory Visit | Attending: Ophthalmology | Admitting: Ophthalmology

## 2023-09-21 ENCOUNTER — Other Ambulatory Visit: Payer: Self-pay

## 2023-09-21 HISTORY — DX: Vitamin D deficiency, unspecified: E55.9

## 2023-09-21 HISTORY — DX: Unspecified osteoarthritis, unspecified site: M19.90

## 2023-09-21 HISTORY — DX: Essential (primary) hypertension: I10

## 2023-09-25 ENCOUNTER — Ambulatory Visit (HOSPITAL_COMMUNITY): Payer: Medicare PPO | Admitting: Certified Registered"

## 2023-09-25 ENCOUNTER — Ambulatory Visit (HOSPITAL_COMMUNITY)
Admission: RE | Admit: 2023-09-25 | Discharge: 2023-09-25 | Disposition: A | Discharge status: Home / Self Care | Payer: Medicare PPO | Attending: Ophthalmology | Admitting: Ophthalmology

## 2023-09-25 ENCOUNTER — Encounter (HOSPITAL_COMMUNITY)
Admission: RE | Disposition: A | Discharge status: Home / Self Care | Payer: Self-pay | Source: Home / Self Care | Attending: Ophthalmology

## 2023-09-25 DIAGNOSIS — H02834 Dermatochalasis of left upper eyelid: Secondary | ICD-10-CM | POA: Diagnosis not present

## 2023-09-25 DIAGNOSIS — H02831 Dermatochalasis of right upper eyelid: Secondary | ICD-10-CM | POA: Diagnosis not present

## 2023-09-25 DIAGNOSIS — I10 Essential (primary) hypertension: Secondary | ICD-10-CM | POA: Insufficient documentation

## 2023-09-25 DIAGNOSIS — H0100A Unspecified blepharitis right eye, upper and lower eyelids: Secondary | ICD-10-CM | POA: Diagnosis not present

## 2023-09-25 DIAGNOSIS — H0100B Unspecified blepharitis left eye, upper and lower eyelids: Secondary | ICD-10-CM | POA: Diagnosis not present

## 2023-09-25 DIAGNOSIS — H25812 Combined forms of age-related cataract, left eye: Secondary | ICD-10-CM | POA: Insufficient documentation

## 2023-09-25 DIAGNOSIS — H04123 Dry eye syndrome of bilateral lacrimal glands: Secondary | ICD-10-CM | POA: Diagnosis not present

## 2023-09-25 DIAGNOSIS — H11153 Pinguecula, bilateral: Secondary | ICD-10-CM | POA: Diagnosis not present

## 2023-09-25 HISTORY — PX: CATARACT EXTRACTION W/PHACO: SHX586

## 2023-09-25 SURGERY — PHACOEMULSIFICATION, CATARACT, WITH IOL INSERTION
Anesthesia: Monitor Anesthesia Care | Site: Eye | Laterality: Left

## 2023-09-25 MED ORDER — STERILE WATER FOR IRRIGATION IR SOLN
Status: DC | PRN
  Administered 2023-09-25: 250 mL

## 2023-09-25 MED ORDER — TROPICAMIDE 1 % OP SOLN
1.0000 [drp] | OPHTHALMIC | Status: AC | PRN
  Administered 2023-09-25 (×3): 1 [drp] via OPHTHALMIC

## 2023-09-25 MED ORDER — BSS IO SOLN
INTRAOCULAR | Status: DC | PRN
  Administered 2023-09-25: 15 mL via INTRAOCULAR

## 2023-09-25 MED ORDER — EPINEPHRINE PF 1 MG/ML IJ SOLN
INTRAOCULAR | Status: DC | PRN
  Administered 2023-09-25: 500 mL

## 2023-09-25 MED ORDER — PHENYLEPHRINE HCL 2.5 % OP SOLN
1.0000 [drp] | OPHTHALMIC | Status: AC | PRN
  Administered 2023-09-25 (×3): 1 [drp] via OPHTHALMIC

## 2023-09-25 MED ORDER — LIDOCAINE HCL (PF) 1 % IJ SOLN
INTRAOCULAR | Status: DC | PRN
  Administered 2023-09-25: 1 mL via OPHTHALMIC

## 2023-09-25 MED ORDER — TETRACAINE HCL 0.5 % OP SOLN
1.0000 [drp] | OPHTHALMIC | Status: AC | PRN
  Administered 2023-09-25 (×3): 1 [drp] via OPHTHALMIC

## 2023-09-25 MED ORDER — SODIUM HYALURONATE 23MG/ML IO SOSY
PREFILLED_SYRINGE | INTRAOCULAR | Status: DC | PRN
  Administered 2023-09-25: .6 mL via INTRAOCULAR

## 2023-09-25 MED ORDER — SODIUM HYALURONATE 10 MG/ML IO SOLUTION
PREFILLED_SYRINGE | INTRAOCULAR | Status: DC | PRN
  Administered 2023-09-25: .85 mL via INTRAOCULAR

## 2023-09-25 MED ORDER — POVIDONE-IODINE 5 % OP SOLN
OPHTHALMIC | Status: DC | PRN
  Administered 2023-09-25: 1 via OPHTHALMIC

## 2023-09-25 MED ORDER — MOXIFLOXACIN HCL 5 MG/ML IO SOLN
INTRAOCULAR | Status: DC | PRN
  Administered 2023-09-25: .2 mL via INTRACAMERAL

## 2023-09-25 MED ORDER — LIDOCAINE HCL 3.5 % OP GEL
1.0000 | Freq: Once | OPHTHALMIC | Status: AC
  Administered 2023-09-25: 1 via OPHTHALMIC

## 2023-09-25 SURGICAL SUPPLY — 14 items
CATARACT SUITE SIGHTPATH (MISCELLANEOUS) ×1
CLOTH BEACON ORANGE TIMEOUT ST (SAFETY) ×1 IMPLANT
EYE SHIELD UNIVERSAL CLEAR (GAUZE/BANDAGES/DRESSINGS) IMPLANT
FEE CATARACT SUITE SIGHTPATH (MISCELLANEOUS) ×1 IMPLANT
GLOVE BIOGEL PI IND STRL 7.0 (GLOVE) ×2 IMPLANT
LENS IOL TECNIS EYHANCE 21.0 (Intraocular Lens) IMPLANT
NDL HYPO 18GX1.5 BLUNT FILL (NEEDLE) ×1 IMPLANT
NEEDLE HYPO 18GX1.5 BLUNT FILL (NEEDLE) ×1
PAD ARMBOARD 7.5X6 YLW CONV (MISCELLANEOUS) ×1 IMPLANT
POSITIONER HEAD 8X9X4 ADT (SOFTGOODS) ×1 IMPLANT
RING MALYGIN 7.0 (MISCELLANEOUS) IMPLANT
SYR TB 1ML LL NO SAFETY (SYRINGE) ×1 IMPLANT
TAPE SURG TRANSPORE 1 IN (GAUZE/BANDAGES/DRESSINGS) IMPLANT
WATER STERILE IRR 250ML POUR (IV SOLUTION) ×1 IMPLANT

## 2023-09-25 NOTE — Op Note (Signed)
Date of procedure: 09/25/23  Pre-operative diagnosis: Visually significant age-related combined cataract, Left Eye (H25.812)  Post-operative diagnosis: Visually significant age-related combined cataract, Left Eye (H25.812)  Procedure: Removal of cataract via phacoemulsification and insertion of intra-ocular lens [employer] DIB00 +21.0D into the capsular bag of the Left Eye  Attending surgeon: Rudy Jew. Rance Smithson, MD, MA  Anesthesia: MAC, Topical Akten  Complications: None  Estimated Blood Loss: <70mL (minimal)  Specimens: None  Implants: As above  Indications:  Visually significant age-related cataract, Left Eye  Procedure:  The patient was seen and identified in the pre-operative area. The operative eye was identified and dilated.  The operative eye was marked.  Topical anesthesia was administered to the operative eye.     The patient was then to the operative suite and placed in the supine position.  A timeout was performed confirming the patient, procedure to be performed, and all other relevant information.   The patient's face was prepped and draped in the usual fashion for intra-ocular surgery.  A lid speculum was placed into the operative eye and the surgical microscope moved into place and focused.  An inferotemporal paracentesis was created using a 20 gauge paracentesis blade.  Shugarcaine was injected into the anterior chamber.  Viscoelastic was injected into the anterior chamber.  A temporal clear-corneal main wound incision was created using a 2.61mm microkeratome.  A continuous curvilinear capsulorrhexis was initiated using an irrigating cystitome and completed using capsulorrhexis forceps.  Hydrodissection and hydrodeliniation were performed.  Viscoelastic was injected into the anterior chamber.  A phacoemulsification handpiece and a chopper as a second instrument were used to remove the nucleus and epinucleus. The irrigation/aspiration handpiece was used to remove any  remaining cortical material.   The capsular bag was reinflated with viscoelastic, checked, and found to be intact.  The intraocular lens was inserted into the capsular bag.  The irrigation/aspiration handpiece was used to remove any remaining viscoelastic.  The clear corneal wound and paracentesis wounds were then hydrated and checked with Weck-Cels to be watertight. 0.12mL of Moxfloxacin was injected into the anterior chamber. The lid-speculum was removed.  The drape was removed.  The patient's face was cleaned with a wet and dry 4x4.    A clear shield was taped over the eye. The patient was taken to the post-operative care unit in good condition, having tolerated the procedure well.  Post-Op Instructions: The patient will follow up at [facility] for a same day post-operative evaluation and will receive all other orders and instructions.

## 2023-09-25 NOTE — Anesthesia Postprocedure Evaluation (Signed)
Anesthesia Post Note  Patient: Amber Mckay  Procedure(s) Performed: CATARACT EXTRACTION PHACO AND INTRAOCULAR LENS PLACEMENT (IOC) (Left: Eye)  Patient location during evaluation: PACU Anesthesia Type: MAC Level of consciousness: awake and alert Pain management: pain level controlled Vital Signs Assessment: post-procedure vital signs reviewed and stable Respiratory status: spontaneous breathing, nonlabored ventilation, respiratory function stable and patient connected to nasal cannula oxygen Cardiovascular status: stable and blood pressure returned to baseline Postop Assessment: no apparent nausea or vomiting Anesthetic complications: no   There were no known notable events for this encounter.   Last Vitals:  Vitals:   09/25/23 1115 09/25/23 1151  BP: (!) 154/64 (!) 174/72  Pulse: 81 80  Resp: 20 18  Temp:  36.6 C  SpO2: 97% 100%    Last Pain:  Vitals:   09/25/23 1151  TempSrc: Oral  PainSc: 0-No pain                 Isak Sotomayor L Cassity Christian

## 2023-09-25 NOTE — Transfer of Care (Signed)
Immediate Anesthesia Transfer of Care Note  Patient: Amber Mckay  Procedure(s) Performed: CATARACT EXTRACTION PHACO AND INTRAOCULAR LENS PLACEMENT (IOC) (Left: Eye)  Patient Location: Short Stay  Anesthesia Type: MAC Level of Consciousness: awake and alert   Airway & Oxygen Therapy: Patient Spontanous Breathing  Post-op Assessment: Report given to RN and Post -op Vital signs reviewed and stable  Post vital signs: Reviewed and stable  Last Vitals:  Vitals Value Taken Time  BP    Temp    Pulse    Resp    SpO2      Last Pain:  Vitals:   09/25/23 1100  PainSc: 0-No pain         Complications: No notable events documented.

## 2023-09-25 NOTE — Anesthesia Preprocedure Evaluation (Signed)
Anesthesia Evaluation  Patient identified by MRN, date of birth, ID band Patient awake    Reviewed: Allergy & Precautions, H&P , NPO status , Patient's Chart, lab work & pertinent test results, reviewed documented beta blocker date and time   Airway Mallampati: II  TM Distance: >3 FB Neck ROM: full    Dental no notable dental hx. (+) Dental Advisory Given, Teeth Intact   Pulmonary neg pulmonary ROS   Pulmonary exam normal breath sounds clear to auscultation       Cardiovascular Exercise Tolerance: Good hypertension, Normal cardiovascular exam Rhythm:regular Rate:Normal     Neuro/Psych negative neurological ROS  negative psych ROS   GI/Hepatic negative GI ROS, Neg liver ROS,,,  Endo/Other  negative endocrine ROS    Renal/GU negative Renal ROS  negative genitourinary   Musculoskeletal  (+) Arthritis , Osteoarthritis,    Abdominal   Peds  Hematology negative hematology ROS (+)   Anesthesia Other Findings   Reproductive/Obstetrics negative OB ROS                             Anesthesia Physical Anesthesia Plan  ASA: 2  Anesthesia Plan: MAC   Post-op Pain Management: Minimal or no pain anticipated   Induction:   PONV Risk Score and Plan:   Airway Management Planned: Nasal Cannula and Natural Airway  Additional Equipment: None  Intra-op Plan:   Post-operative Plan:   Informed Consent: I have reviewed the patients History and Physical, chart, labs and discussed the procedure including the risks, benefits and alternatives for the proposed anesthesia with the patient or authorized representative who has indicated his/her understanding and acceptance.     Dental Advisory Given  Plan Discussed with: CRNA  Anesthesia Plan Comments:        Anesthesia Quick Evaluation

## 2023-09-25 NOTE — Discharge Instructions (Addendum)
Please discharge patient when stable, will follow up today with Dr. Carnella Fryman at the [facility] office immediately following discharge.  Leave shield in place until visit.  All paperwork with discharge instructions will be given at the office.  [facility] Address:  [address]  [place], [place] [address]  

## 2023-09-25 NOTE — Anesthesia Procedure Notes (Signed)
Procedure Name: MAC Date/Time: 09/25/2023 11:29 AM  Performed by: Julian Reil, CRNAPre-anesthesia Checklist: Emergency Drugs available, Patient identified, Suction available and Patient being monitored Patient Re-evaluated:Patient Re-evaluated prior to induction Oxygen Delivery Method: Nasal cannula Placement Confirmation: positive ETCO2

## 2023-09-25 NOTE — Interval H&P Note (Signed)
History and Physical Interval Note:  09/25/2023 11:25 AM  [name]  has presented today for surgery, with the diagnosis of combined forms age related cataract, left eye.  The various methods of treatment have been discussed with the patient and family. After consideration of risks, benefits and other options for treatment, the patient has consented to  Procedure(s) with comments: CATARACT EXTRACTION PHACO AND INTRAOCULAR LENS PLACEMENT (IOC) (Left) - CDE: as a surgical intervention.  The patient's history has been reviewed, patient examined, no change in status, stable for surgery.  I have reviewed the patient's chart and labs.  Questions were answered to the patient's satisfaction.     Amber Mckay

## 2023-09-27 ENCOUNTER — Encounter (HOSPITAL_COMMUNITY): Payer: Self-pay | Admitting: Ophthalmology

## 2023-10-14 DIAGNOSIS — K219 Gastro-esophageal reflux disease without esophagitis: Secondary | ICD-10-CM | POA: Diagnosis not present

## 2023-10-14 DIAGNOSIS — H269 Unspecified cataract: Secondary | ICD-10-CM | POA: Diagnosis not present

## 2023-10-14 DIAGNOSIS — I10 Essential (primary) hypertension: Secondary | ICD-10-CM | POA: Diagnosis not present

## 2023-10-14 DIAGNOSIS — I739 Peripheral vascular disease, unspecified: Secondary | ICD-10-CM | POA: Diagnosis not present

## 2023-10-14 DIAGNOSIS — D529 Folate deficiency anemia, unspecified: Secondary | ICD-10-CM | POA: Diagnosis not present

## 2023-10-14 DIAGNOSIS — F419 Anxiety disorder, unspecified: Secondary | ICD-10-CM | POA: Diagnosis not present

## 2023-10-14 DIAGNOSIS — G4700 Insomnia, unspecified: Secondary | ICD-10-CM | POA: Diagnosis not present

## 2023-10-14 DIAGNOSIS — M069 Rheumatoid arthritis, unspecified: Secondary | ICD-10-CM | POA: Diagnosis not present

## 2023-10-14 DIAGNOSIS — R32 Unspecified urinary incontinence: Secondary | ICD-10-CM | POA: Diagnosis not present

## 2023-11-07 ENCOUNTER — Encounter (HOSPITAL_COMMUNITY)
Admission: RE | Admit: 2023-11-07 | Discharge: 2023-11-07 | Disposition: A | Discharge status: Home / Self Care | Payer: Medicare PPO | Source: Ambulatory Visit | Attending: Ophthalmology | Admitting: Ophthalmology

## 2023-11-07 ENCOUNTER — Encounter (HOSPITAL_COMMUNITY): Payer: Self-pay

## 2023-11-07 ENCOUNTER — Other Ambulatory Visit: Payer: Self-pay

## 2023-11-07 HISTORY — DX: Other specified postprocedural states: Z98.890

## 2023-11-08 NOTE — H&P (Signed)
 Surgical History & Physical  Patient Name: Glendine Swetz  DOB: May 13, 1935  Surgery: Cataract extraction with intraocular lens implant phacoemulsification; Right Eye Surgeon: Lynwood Hermann MD Surgery Date: 11/13/2023 Pre-Op Date: 10/02/2023  HPI: A 54 Yr. old female patient 1. The patient is returning for a cataract follow-up of the left eye. Since the last visit, the affected area is doing well. The patient's vision is improved. The condition's severity is constant. Patient is following medication instructions. Patient also presents with consistently blurry vision in the right eye. This has been present for several months. It has been worsening. She has trouble with glare when driving at night and reading in lower light. This is negatively affecting the patient's quality of life and the patient is unable to function adequately in life with the current level of vision. Pt.is ready to proceed with cataract surgery on OD, due to bright lights. HPI Completed by Dr. Lynwood Hermann  Medical History: Cataracts  Arthritis LDL Lung Problems  Review of Systems Cardiovascular High Blood Pressure Musculoskeletal Joint Ache, Pain Respiratory Asthma All recorded systems are negative except as noted above.  Social Never smoked   Medication Prednisolone-moxiflox-bromfen,  Lisinopril, Albuterol, Folic acid, Methotrexate ,  triamcinolone acetonide ,  folic acid ,  hydroxychloroquine ,  lisinopril ,  albuterol sulfate ,  methotrexate  sodium ,  Arnuity Ellipta ,  pantoprazole   Sx/Procedures Phaco c IOL OS,  Hysterectomy  Drug Allergies  Sulfa (Sulfonamide Antibiotics)   History & Physical: Heent: cataract NECK: supple without bruits LUNGS: lungs clear to auscultation CV: regular rate and rhythm Abdomen: soft and non-tender  Impression & Plan: Assessment: 1.  CATARACT EXTRACTION STATUS; Left Eye (Z98.42) 2.  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  1 week after  cataract surgery. Doing well with improved vision and normal eye pressure. Call with any problems or concerns. Continue Pred-Moxi-Brom 2x/day for 3 more weeks.  2.  Cataract accounts for the patient's decreased vision. This visual impairment is not correctable with a tolerable change in glasses or contact lenses. Cataract surgery with an implantation of a new lens should significantly improve the visual and functional status of the patient. Discussed all risks, benefits, alternatives, and potential complications. Discussed the procedures and recovery. Patient desires to have surgery. A-scan ordered and performed today for intra-ocular lens calculations. The surgery will be performed in order to improve vision for driving, reading, and for eye examinations. Recommend phacoemulsification with intra-ocular lens. Recommend Dextenza  for post-operative pain and inflammation. Right Eye. Surgery required to correct imbalance of vision. Dilates well - shugarcaine by protocol.

## 2023-11-23 DIAGNOSIS — H25811 Combined forms of age-related cataract, right eye: Secondary | ICD-10-CM | POA: Diagnosis not present

## 2023-11-29 ENCOUNTER — Encounter (HOSPITAL_COMMUNITY)
Admission: RE | Admit: 2023-11-29 | Discharge: 2023-11-29 | Disposition: A | Discharge status: Home / Self Care | Payer: Medicare PPO | Source: Ambulatory Visit | Attending: Ophthalmology | Admitting: Ophthalmology

## 2023-11-29 ENCOUNTER — Other Ambulatory Visit: Payer: Self-pay

## 2023-11-29 ENCOUNTER — Encounter (HOSPITAL_COMMUNITY): Payer: Self-pay

## 2023-11-29 NOTE — Pre-Procedure Instructions (Signed)
PAT visit completed with patient over the phone at this time. Note placed on patient's chart that she states she vomit at home after last eye surgery.

## 2023-11-30 NOTE — H&P (Signed)
Surgical History & Physical  Patient Name: Amber Mckay  DOB: 08/17/1935  Surgery: Cataract extraction with intraocular lens implant phacoemulsification; Right Eye Surgeon: Fabio Pierce MD Surgery Date: 12/01/2023 Pre-Op Date: 10/02/2023  HPI: [name] old female patient 1. The patient is returning for a cataract follow-up of the left eye. Since the last visit, the affected area is doing well. The patient's vision is improved. The condition's severity is constant. Patient is following medication instructions. Patient also presents with consistently blurry vision in the right eye. This has been present for several months. It has been worsening. She has trouble with glare when driving at night and reading in lower light. This is negatively affecting the patient's quality of life and the patient is unable to function adequately in life with the current level of vision. Pt.is ready to proceed with cataract surgery on OD, due to bright lights. HPI Completed by Dr. Fabio Pierce  Medical History: Cataracts  Arthritis LDL Lung Problems  Review of Systems Cardiovascular High Blood Pressure Musculoskeletal Joint Ache, Pain Respiratory Asthma All recorded systems are negative except as noted above.  Social Never smoked   Medication Prednisolone-moxiflox-bromfen,  triamcinolone acetonide ,  folic acid ,  hydroxychloroquine ,  lisinopril ,  albuterol sulfate ,  methotrexate sodium ,  Arnuity Ellipta ,  pantoprazole   Sx/Procedures Phaco c IOL OS,  Hysterectomy  Drug Allergies  Sulfa (Sulfonamide Antibiotics)   History & Physical: Heent: cataract NECK: supple without bruits LUNGS: lungs clear to auscultation CV: regular rate and rhythm Abdomen: soft and non-tender  Impression & Plan: Assessment: 1.  CATARACT EXTRACTION STATUS; Left Eye (Z98.42) 2.  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  1 week after cataract surgery. Doing well with improved vision  and normal eye pressure. Call with any problems or concerns. Continue Pred-Moxi-Brom 2x/day for 3 more weeks.  2.  Cataract accounts for the patient's decreased vision. This visual impairment is not correctable with a tolerable change in glasses or contact lenses. Cataract surgery with an implantation of a new lens should significantly improve the visual and functional status of the patient. Discussed all risks, benefits, alternatives, and potential complications. Discussed the procedures and recovery. Patient desires to have surgery. A-scan ordered and performed today for intra-ocular lens calculations. The surgery will be performed in order to improve vision for driving, reading, and for eye examinations. Recommend phacoemulsification with intra-ocular lens. Recommend Dextenza for post-operative pain and inflammation. Right Eye. Surgery required to correct imbalance of vision. Dilates well - shugarcaine by protocol.

## 2023-12-01 ENCOUNTER — Encounter (HOSPITAL_COMMUNITY): Payer: Self-pay | Admitting: Ophthalmology

## 2023-12-01 ENCOUNTER — Encounter (HOSPITAL_COMMUNITY)
Admission: RE | Disposition: A | Discharge status: Home / Self Care | Payer: Self-pay | Source: Home / Self Care | Attending: Ophthalmology

## 2023-12-01 ENCOUNTER — Ambulatory Visit (HOSPITAL_COMMUNITY): Payer: Medicare PPO | Admitting: Certified Registered Nurse Anesthetist

## 2023-12-01 ENCOUNTER — Ambulatory Visit (HOSPITAL_COMMUNITY)
Admission: RE | Admit: 2023-12-01 | Discharge: 2023-12-01 | Disposition: A | Discharge status: Home / Self Care | Payer: Medicare PPO | Attending: Ophthalmology | Admitting: Ophthalmology

## 2023-12-01 ENCOUNTER — Ambulatory Visit: Admit: 2023-12-01 | Payer: Medicare PPO | Admitting: Ophthalmology

## 2023-12-01 DIAGNOSIS — M1990 Unspecified osteoarthritis, unspecified site: Secondary | ICD-10-CM | POA: Diagnosis not present

## 2023-12-01 DIAGNOSIS — H25811 Combined forms of age-related cataract, right eye: Secondary | ICD-10-CM | POA: Diagnosis not present

## 2023-12-01 DIAGNOSIS — I10 Essential (primary) hypertension: Secondary | ICD-10-CM | POA: Diagnosis not present

## 2023-12-01 HISTORY — PX: CATARACT EXTRACTION W/PHACO: SHX586

## 2023-12-01 SURGERY — CATARACT EXTRACTION PHACO AND INTRAOCULAR LENS PLACEMENT (IOC)
Anesthesia: Monitor Anesthesia Care | Laterality: Right

## 2023-12-01 SURGERY — PHACOEMULSIFICATION, CATARACT, WITH IOL INSERTION
Anesthesia: Monitor Anesthesia Care | Site: Eye | Laterality: Right

## 2023-12-01 MED ORDER — PHENYLEPHRINE HCL 2.5 % OP SOLN
1.0000 [drp] | OPHTHALMIC | Status: AC | PRN
  Administered 2023-12-01 (×3): 1 [drp] via OPHTHALMIC

## 2023-12-01 MED ORDER — SODIUM HYALURONATE 23MG/ML IO SOSY
PREFILLED_SYRINGE | INTRAOCULAR | Status: DC | PRN
  Administered 2023-12-01: .6 mL via INTRAOCULAR

## 2023-12-01 MED ORDER — FENTANYL CITRATE (PF) 100 MCG/2ML IJ SOLN
INTRAMUSCULAR | Status: AC
  Filled 2023-12-01: qty 2

## 2023-12-01 MED ORDER — SODIUM HYALURONATE 10 MG/ML IO SOLUTION
PREFILLED_SYRINGE | INTRAOCULAR | Status: DC | PRN
  Administered 2023-12-01: .85 mL via INTRAOCULAR

## 2023-12-01 MED ORDER — EPINEPHRINE PF 1 MG/ML IJ SOLN
INTRAOCULAR | Status: DC | PRN
  Administered 2023-12-01: 500 mL

## 2023-12-01 MED ORDER — LIDOCAINE HCL 3.5 % OP GEL
1.0000 | Freq: Once | OPHTHALMIC | Status: AC
  Administered 2023-12-01: 1 via OPHTHALMIC

## 2023-12-01 MED ORDER — TETRACAINE HCL 0.5 % OP SOLN
1.0000 [drp] | OPHTHALMIC | Status: AC | PRN
  Administered 2023-12-01 (×3): 1 [drp] via OPHTHALMIC

## 2023-12-01 MED ORDER — TROPICAMIDE 1 % OP SOLN
1.0000 [drp] | OPHTHALMIC | Status: AC | PRN
  Administered 2023-12-01 (×3): 1 [drp] via OPHTHALMIC

## 2023-12-01 MED ORDER — STERILE WATER FOR IRRIGATION IR SOLN
Status: DC | PRN
  Administered 2023-12-01: 25 mL

## 2023-12-01 MED ORDER — LIDOCAINE HCL (PF) 1 % IJ SOLN
INTRAOCULAR | Status: DC | PRN
  Administered 2023-12-01: 1 mL via OPHTHALMIC

## 2023-12-01 MED ORDER — MOXIFLOXACIN HCL 5 MG/ML IO SOLN
INTRAOCULAR | Status: DC | PRN
  Administered 2023-12-01: .3 mL via INTRACAMERAL

## 2023-12-01 MED ORDER — ONDANSETRON HCL 4 MG/2ML IJ SOLN
INTRAMUSCULAR | Status: DC | PRN
  Administered 2023-12-01: 4 mg via INTRAVENOUS

## 2023-12-01 MED ORDER — BSS IO SOLN
INTRAOCULAR | Status: DC | PRN
  Administered 2023-12-01: 15 mL via INTRAOCULAR

## 2023-12-01 MED ORDER — MIDAZOLAM HCL 2 MG/2ML IJ SOLN
INTRAMUSCULAR | Status: AC
  Filled 2023-12-01: qty 2

## 2023-12-01 MED ORDER — POVIDONE-IODINE 5 % OP SOLN
OPHTHALMIC | Status: DC | PRN
  Administered 2023-12-01: 1 via OPHTHALMIC

## 2023-12-01 MED ORDER — ONDANSETRON HCL 4 MG/2ML IJ SOLN
INTRAMUSCULAR | Status: AC
  Filled 2023-12-01: qty 2

## 2023-12-01 SURGICAL SUPPLY — 13 items
CATARACT SUITE SIGHTPATH (MISCELLANEOUS) ×1 IMPLANT
CLOTH BEACON ORANGE TIMEOUT ST (SAFETY) ×1 IMPLANT
EYE SHIELD UNIVERSAL CLEAR (GAUZE/BANDAGES/DRESSINGS) IMPLANT
FEE CATARACT SUITE SIGHTPATH (MISCELLANEOUS) ×1 IMPLANT
GLOVE BIOGEL PI IND STRL 6.5 (GLOVE) IMPLANT
GLOVE BIOGEL PI IND STRL 7.0 (GLOVE) ×2 IMPLANT
LENS IOL TECNIS EYHANCE 21.5 (Intraocular Lens) IMPLANT
NDL HYPO 18GX1.5 BLUNT FILL (NEEDLE) ×1 IMPLANT
NEEDLE HYPO 18GX1.5 BLUNT FILL (NEEDLE) ×1 IMPLANT
PAD ARMBOARD 7.5X6 YLW CONV (MISCELLANEOUS) ×1 IMPLANT
SYR TB 1ML LL NO SAFETY (SYRINGE) ×1 IMPLANT
TAPE SURG TRANSPORE 1 IN (GAUZE/BANDAGES/DRESSINGS) IMPLANT
WATER STERILE IRR 250ML POUR (IV SOLUTION) ×1 IMPLANT

## 2023-12-01 NOTE — Discharge Instructions (Addendum)
 Please discharge patient when stable, will follow up today with Dr. June Leap at the [facility] office immediately following discharge.  Leave shield in place until visit.  All paperwork with discharge instructions will be given at the office.  [facility] Address:  [address]  [place], [place] [address]

## 2023-12-01 NOTE — Interval H&P Note (Signed)
History and Physical Interval Note:  12/01/2023 8:45 AM  Amber Mckay  has presented today for surgery, with the diagnosis of combined forms age related cataract, right eye.  The various methods of treatment have been discussed with the patient and family. After consideration of risks, benefits and other options for treatment, the patient has consented to  Procedure(s) with comments: CATARACT EXTRACTION PHACO AND INTRAOCULAR LENS PLACEMENT (IOC) (Right) - CDE: as a surgical intervention.  The patient's history has been reviewed, patient examined, no change in status, stable for surgery.  I have reviewed the patient's chart and labs.  Questions were answered to the patient's satisfaction.     Fabio Pierce

## 2023-12-01 NOTE — Anesthesia Preprocedure Evaluation (Signed)
 Anesthesia Evaluation  Patient identified by MRN, date of birth, ID band Patient awake    Reviewed: Allergy & Precautions, H&P , NPO status , Patient's Chart, lab work & pertinent test results, reviewed documented beta blocker date and time   Airway Mallampati: II  TM Distance: >3 FB Neck ROM: full    Dental no notable dental hx. (+) Dental Advisory Given, Teeth Intact   Pulmonary neg pulmonary ROS   Pulmonary exam normal breath sounds clear to auscultation       Cardiovascular Exercise Tolerance: Good hypertension, Normal cardiovascular exam Rhythm:regular Rate:Normal     Neuro/Psych negative neurological ROS  negative psych ROS   GI/Hepatic negative GI ROS, Neg liver ROS,,,  Endo/Other  negative endocrine ROS    Renal/GU negative Renal ROS  negative genitourinary   Musculoskeletal  (+) Arthritis , Osteoarthritis,    Abdominal   Peds  Hematology negative hematology ROS (+)   Anesthesia Other Findings   Reproductive/Obstetrics negative OB ROS                             Anesthesia Physical Anesthesia Plan  ASA: 2  Anesthesia Plan: MAC   Post-op Pain Management: Minimal or no pain anticipated   Induction:   PONV Risk Score and Plan:   Airway Management Planned: Nasal Cannula and Natural Airway  Additional Equipment: None  Intra-op Plan:   Post-operative Plan:   Informed Consent: I have reviewed the patients History and Physical, chart, labs and discussed the procedure including the risks, benefits and alternatives for the proposed anesthesia with the patient or authorized representative who has indicated his/her understanding and acceptance.     Dental Advisory Given  Plan Discussed with: CRNA  Anesthesia Plan Comments:        Anesthesia Quick Evaluation

## 2023-12-01 NOTE — Anesthesia Postprocedure Evaluation (Signed)
Anesthesia Post Note  Patient: Amber Mckay  Procedure(s) Performed: CATARACT EXTRACTION PHACO AND INTRAOCULAR LENS PLACEMENT (IOC) (Right: Eye)  Patient location during evaluation: Phase II Anesthesia Type: MAC Level of consciousness: awake Pain management: pain level controlled Vital Signs Assessment: post-procedure vital signs reviewed and stable Respiratory status: spontaneous breathing and respiratory function stable Cardiovascular status: blood pressure returned to baseline and stable Postop Assessment: no headache and no apparent nausea or vomiting Anesthetic complications: no Comments: Late entry   No notable events documented.   Last Vitals:  Vitals:   12/01/23 0830 12/01/23 0912  BP: (!) 166/86 (!) 159/83  Pulse: 86 86  Resp: 20 17  Temp:  36.7 C  SpO2: 97% 96%    Last Pain:  Vitals:   12/01/23 0912  TempSrc: Oral  PainSc: 0-No pain                 Windell Norfolk

## 2023-12-01 NOTE — Transfer of Care (Signed)
Immediate Anesthesia Transfer of Care Note  Patient: Amber Mckay  Procedure(s) Performed: CATARACT EXTRACTION PHACO AND INTRAOCULAR LENS PLACEMENT (IOC) (Right: Eye)  Patient Location: PACU  Anesthesia Type:MAC  Level of Consciousness: awake, alert , and oriented  Airway & Oxygen Therapy: Patient Spontanous Breathing  Post-op Assessment: Report given to RN and Post -op Vital signs reviewed and stable  Post vital signs: Reviewed and stable  Last Vitals:  Vitals Value Taken Time  BP 160/60   Temp 98   Pulse 80   Resp 16   SpO2 94     Last Pain:  Vitals:   12/01/23 0803  PainSc: 0-No pain         Complications: No notable events documented.

## 2023-12-01 NOTE — Op Note (Signed)
Date of procedure: 12/01/23  Pre-operative diagnosis:  Visually significant combined form age-related cataract, Right Eye (H25.811)  Post-operative diagnosis:  Visually significant combined form age-related cataract, Right Eye (H25.811)  Procedure: Removal of cataract via phacoemulsification and insertion of intra-ocular lens Laural Benes and Johnson DIB00 +21.5D into the capsular bag of the Right Eye  Attending surgeon: Rudy Jew. Trixy Loyola, MD, MA  Anesthesia: MAC, Topical Akten  Complications: None  Estimated Blood Loss: <31mL (minimal)  Specimens: None  Implants: As above  Indications:  Visually significant age-related cataract, Right Eye  Procedure:  The patient was seen and identified in the pre-operative area. The operative eye was identified and dilated.  The operative eye was marked.  Topical anesthesia was administered to the operative eye.     The patient was then to the operative suite and placed in the supine position.  A timeout was performed confirming the patient, procedure to be performed, and all other relevant information.   The patient's face was prepped and draped in the usual fashion for intra-ocular surgery.  A lid speculum was placed into the operative eye and the surgical microscope moved into place and focused.  A superotemporal paracentesis was created using a 20 gauge paracentesis blade.  Shugarcaine was injected into the anterior chamber.  Viscoelastic was injected into the anterior chamber.  A temporal clear-corneal main wound incision was created using a 2.82mm microkeratome.  A continuous curvilinear capsulorrhexis was initiated using an irrigating cystitome and completed using capsulorrhexis forceps.  Hydrodissection and hydrodeliniation were performed.  Viscoelastic was injected into the anterior chamber.  A phacoemulsification handpiece and a chopper as a second instrument were used to remove the nucleus and epinucleus. The irrigation/aspiration handpiece was used to  remove any remaining cortical material.   The capsular bag was reinflated with viscoelastic, checked, and found to be intact.  The intraocular lens was inserted into the capsular bag.  The irrigation/aspiration handpiece was used to remove any remaining viscoelastic.  The clear corneal wound and paracentesis wounds were then hydrated and checked with Weck-Cels to be watertight. 0.3mL of Moxfloxacin was injected into the anterior chamber. The lid-speculum was removed.  The drape was removed.  The patient's face was cleaned with a wet and dry 4x4. A clear shield was taped over the eye. The patient was taken to the post-operative care unit in good condition, having tolerated the procedure well.  Post-Op Instructions: The patient will follow up at [facility] for a same day post-operative evaluation and will receive all other orders and instructions.

## 2023-12-03 ENCOUNTER — Encounter (HOSPITAL_COMMUNITY): Payer: Self-pay | Admitting: Ophthalmology

## 2023-12-08 DIAGNOSIS — M7581 Other shoulder lesions, right shoulder: Secondary | ICD-10-CM | POA: Diagnosis not present

## 2023-12-08 DIAGNOSIS — R5383 Other fatigue: Secondary | ICD-10-CM | POA: Diagnosis not present

## 2023-12-08 DIAGNOSIS — M0609 Rheumatoid arthritis without rheumatoid factor, multiple sites: Secondary | ICD-10-CM | POA: Diagnosis not present

## 2023-12-08 DIAGNOSIS — Z6821 Body mass index (BMI) 21.0-21.9, adult: Secondary | ICD-10-CM | POA: Diagnosis not present

## 2023-12-08 DIAGNOSIS — M25562 Pain in left knee: Secondary | ICD-10-CM | POA: Diagnosis not present

## 2023-12-08 DIAGNOSIS — M1991 Primary osteoarthritis, unspecified site: Secondary | ICD-10-CM | POA: Diagnosis not present

## 2023-12-08 DIAGNOSIS — Z111 Encounter for screening for respiratory tuberculosis: Secondary | ICD-10-CM | POA: Diagnosis not present

## 2023-12-08 DIAGNOSIS — M810 Age-related osteoporosis without current pathological fracture: Secondary | ICD-10-CM | POA: Diagnosis not present

## 2023-12-08 DIAGNOSIS — E559 Vitamin D deficiency, unspecified: Secondary | ICD-10-CM | POA: Diagnosis not present

## 2023-12-28 DIAGNOSIS — E7849 Other hyperlipidemia: Secondary | ICD-10-CM | POA: Diagnosis not present

## 2023-12-28 DIAGNOSIS — I10 Essential (primary) hypertension: Secondary | ICD-10-CM | POA: Diagnosis not present

## 2024-01-04 DIAGNOSIS — I10 Essential (primary) hypertension: Secondary | ICD-10-CM | POA: Diagnosis not present

## 2024-01-04 DIAGNOSIS — Z6821 Body mass index (BMI) 21.0-21.9, adult: Secondary | ICD-10-CM | POA: Diagnosis not present

## 2024-01-04 DIAGNOSIS — Z0000 Encounter for general adult medical examination without abnormal findings: Secondary | ICD-10-CM | POA: Diagnosis not present

## 2024-01-04 DIAGNOSIS — Z0001 Encounter for general adult medical examination with abnormal findings: Secondary | ICD-10-CM | POA: Diagnosis not present

## 2024-01-04 DIAGNOSIS — E7849 Other hyperlipidemia: Secondary | ICD-10-CM | POA: Diagnosis not present

## 2024-01-04 DIAGNOSIS — J45909 Unspecified asthma, uncomplicated: Secondary | ICD-10-CM | POA: Diagnosis not present

## 2024-01-04 DIAGNOSIS — M0609 Rheumatoid arthritis without rheumatoid factor, multiple sites: Secondary | ICD-10-CM | POA: Diagnosis not present

## 2024-01-04 DIAGNOSIS — E782 Mixed hyperlipidemia: Secondary | ICD-10-CM | POA: Diagnosis not present

## 2024-01-15 DIAGNOSIS — Z23 Encounter for immunization: Secondary | ICD-10-CM | POA: Diagnosis not present

## 2024-01-30 DIAGNOSIS — E559 Vitamin D deficiency, unspecified: Secondary | ICD-10-CM | POA: Diagnosis not present

## 2024-01-30 DIAGNOSIS — R5383 Other fatigue: Secondary | ICD-10-CM | POA: Diagnosis not present

## 2024-01-30 DIAGNOSIS — M810 Age-related osteoporosis without current pathological fracture: Secondary | ICD-10-CM | POA: Diagnosis not present

## 2024-01-30 DIAGNOSIS — M25562 Pain in left knee: Secondary | ICD-10-CM | POA: Diagnosis not present

## 2024-01-30 DIAGNOSIS — M1991 Primary osteoarthritis, unspecified site: Secondary | ICD-10-CM | POA: Diagnosis not present

## 2024-01-30 DIAGNOSIS — M0609 Rheumatoid arthritis without rheumatoid factor, multiple sites: Secondary | ICD-10-CM | POA: Diagnosis not present

## 2024-01-30 DIAGNOSIS — M7581 Other shoulder lesions, right shoulder: Secondary | ICD-10-CM | POA: Diagnosis not present

## 2024-01-30 DIAGNOSIS — Z111 Encounter for screening for respiratory tuberculosis: Secondary | ICD-10-CM | POA: Diagnosis not present

## 2024-01-30 DIAGNOSIS — Z6821 Body mass index (BMI) 21.0-21.9, adult: Secondary | ICD-10-CM | POA: Diagnosis not present

## 2024-03-18 DIAGNOSIS — Z23 Encounter for immunization: Secondary | ICD-10-CM | POA: Diagnosis not present

## 2024-05-16 DIAGNOSIS — R300 Dysuria: Secondary | ICD-10-CM | POA: Diagnosis not present

## 2024-05-16 DIAGNOSIS — Z6821 Body mass index (BMI) 21.0-21.9, adult: Secondary | ICD-10-CM | POA: Diagnosis not present

## 2024-06-13 DIAGNOSIS — Z1389 Encounter for screening for other disorder: Secondary | ICD-10-CM | POA: Diagnosis not present

## 2024-06-13 DIAGNOSIS — E119 Type 2 diabetes mellitus without complications: Secondary | ICD-10-CM | POA: Diagnosis not present

## 2024-06-13 DIAGNOSIS — E7849 Other hyperlipidemia: Secondary | ICD-10-CM | POA: Diagnosis not present

## 2024-07-11 DIAGNOSIS — E782 Mixed hyperlipidemia: Secondary | ICD-10-CM | POA: Diagnosis not present

## 2024-07-11 DIAGNOSIS — I10 Essential (primary) hypertension: Secondary | ICD-10-CM | POA: Diagnosis not present

## 2024-07-11 DIAGNOSIS — J4540 Moderate persistent asthma, uncomplicated: Secondary | ICD-10-CM | POA: Diagnosis not present

## 2024-07-11 DIAGNOSIS — Z6821 Body mass index (BMI) 21.0-21.9, adult: Secondary | ICD-10-CM | POA: Diagnosis not present

## 2024-07-11 DIAGNOSIS — E7849 Other hyperlipidemia: Secondary | ICD-10-CM | POA: Diagnosis not present

## 2024-07-11 DIAGNOSIS — M0609 Rheumatoid arthritis without rheumatoid factor, multiple sites: Secondary | ICD-10-CM | POA: Diagnosis not present

## 2024-08-19 DIAGNOSIS — Z23 Encounter for immunization: Secondary | ICD-10-CM | POA: Diagnosis not present

## 2024-09-10 DIAGNOSIS — R5383 Other fatigue: Secondary | ICD-10-CM | POA: Diagnosis not present

## 2024-09-10 DIAGNOSIS — E559 Vitamin D deficiency, unspecified: Secondary | ICD-10-CM | POA: Diagnosis not present

## 2024-09-10 DIAGNOSIS — Z6821 Body mass index (BMI) 21.0-21.9, adult: Secondary | ICD-10-CM | POA: Diagnosis not present

## 2024-09-10 DIAGNOSIS — M0609 Rheumatoid arthritis without rheumatoid factor, multiple sites: Secondary | ICD-10-CM | POA: Diagnosis not present

## 2024-09-10 DIAGNOSIS — M810 Age-related osteoporosis without current pathological fracture: Secondary | ICD-10-CM | POA: Diagnosis not present

## 2024-09-10 DIAGNOSIS — M7581 Other shoulder lesions, right shoulder: Secondary | ICD-10-CM | POA: Diagnosis not present

## 2024-09-10 DIAGNOSIS — M25562 Pain in left knee: Secondary | ICD-10-CM | POA: Diagnosis not present

## 2024-09-10 DIAGNOSIS — M25512 Pain in left shoulder: Secondary | ICD-10-CM | POA: Diagnosis not present

## 2024-09-10 DIAGNOSIS — M1991 Primary osteoarthritis, unspecified site: Secondary | ICD-10-CM | POA: Diagnosis not present

## 2024-10-02 DIAGNOSIS — L508 Other urticaria: Secondary | ICD-10-CM | POA: Diagnosis not present
